# Patient Record
Sex: Male | Born: 1935 | Race: Black or African American | Hispanic: No | Marital: Married | Smoking: Former smoker
Health system: Southern US, Community
[De-identification: ages and names within clinical notes are randomized; demographics above are authoritative.]

## PROBLEM LIST (undated history)

## (undated) DIAGNOSIS — Z9289 Personal history of other medical treatment: Secondary | ICD-10-CM

## (undated) DIAGNOSIS — M159 Polyosteoarthritis, unspecified: Secondary | ICD-10-CM

## (undated) DIAGNOSIS — J449 Chronic obstructive pulmonary disease, unspecified: Secondary | ICD-10-CM

## (undated) DIAGNOSIS — K219 Gastro-esophageal reflux disease without esophagitis: Secondary | ICD-10-CM

## (undated) DIAGNOSIS — Z22322 Carrier or suspected carrier of Methicillin resistant Staphylococcus aureus: Secondary | ICD-10-CM

## (undated) DIAGNOSIS — N179 Acute kidney failure, unspecified: Secondary | ICD-10-CM

## (undated) DIAGNOSIS — I10 Essential (primary) hypertension: Secondary | ICD-10-CM

## (undated) DIAGNOSIS — N1330 Unspecified hydronephrosis: Secondary | ICD-10-CM

## (undated) HISTORY — PX: APPENDECTOMY: SHX54

## (undated) HISTORY — PX: HERNIA REPAIR: SHX51

## (undated) HISTORY — DX: Polyosteoarthritis, unspecified: M15.9

## (undated) HISTORY — DX: Gastro-esophageal reflux disease without esophagitis: K21.9

## (undated) HISTORY — DX: Unspecified hydronephrosis: N13.30

## (undated) HISTORY — DX: Chronic obstructive pulmonary disease, unspecified: J44.9

## (undated) HISTORY — DX: Acute kidney failure, unspecified: N17.9

## (undated) HISTORY — PX: TOTAL HIP ARTHROPLASTY: SHX124

## (undated) HISTORY — DX: Personal history of other medical treatment: Z92.89

## (undated) HISTORY — DX: Essential (primary) hypertension: I10

## (undated) HISTORY — DX: Carrier or suspected carrier of methicillin resistant Staphylococcus aureus: Z22.322

---

## 2010-08-15 ENCOUNTER — Ambulatory Visit (HOSPITAL_COMMUNITY): Payer: MEDICARE | Attending: Urology

## 2010-08-15 ENCOUNTER — Ambulatory Visit (HOSPITAL_BASED_OUTPATIENT_CLINIC_OR_DEPARTMENT_OTHER)
Admission: RE | Admit: 2010-08-15 | Discharge: 2010-08-15 | Disposition: A | Discharge status: Home / Self Care | Payer: MEDICARE | Source: Ambulatory Visit | Attending: Urology | Admitting: Urology

## 2010-08-15 ENCOUNTER — Ambulatory Visit (HOSPITAL_COMMUNITY): Payer: MEDICARE

## 2010-08-15 DIAGNOSIS — Z01818 Encounter for other preprocedural examination: Secondary | ICD-10-CM | POA: Insufficient documentation

## 2010-08-15 DIAGNOSIS — R0602 Shortness of breath: Secondary | ICD-10-CM | POA: Insufficient documentation

## 2010-08-15 DIAGNOSIS — Z01810 Encounter for preprocedural cardiovascular examination: Secondary | ICD-10-CM | POA: Insufficient documentation

## 2010-08-15 DIAGNOSIS — N35919 Unspecified urethral stricture, male, unspecified site: Secondary | ICD-10-CM | POA: Insufficient documentation

## 2010-08-15 DIAGNOSIS — N1330 Unspecified hydronephrosis: Secondary | ICD-10-CM | POA: Insufficient documentation

## 2010-08-15 DIAGNOSIS — Z01811 Encounter for preprocedural respiratory examination: Secondary | ICD-10-CM

## 2010-08-15 DIAGNOSIS — R079 Chest pain, unspecified: Secondary | ICD-10-CM | POA: Insufficient documentation

## 2010-08-17 NOTE — Op Note (Signed)
NAMEMARGO, Ray Howard                ACCOUNT NO.:  [id number]  MEDICAL RECORD NO.:  [id number]           PATIENT TYPE:  LOCATION:                                 FACILITY:  PHYSICIAN:  Lillette Boxer. Loghan Kurtzman, M.D.  DATE OF BIRTH:  DATE OF PROCEDURE: DATE OF DISCHARGE:                              OPERATIVE REPORT   PREOPERATIVE DIAGNOSIS:  Benign prostatic hypertrophy with obstruction.  POSTOPERATIVE DIAGNOSIS:  Urethral stricture, no evidence of benign prostatic hypertrophy.  PRINCIPAL PROCEDURE:  Cystoscopy, urethral dilation with balloon end [employer] sounds.  SURGEON:  Lillette Boxer. Kaiyan Luczak, MD  ANESTHESIA:  General with LMA.  COMPLICATIONS:  None.  SPECIMENS:  None.  BRIEF HISTORY:  This 77 year old male presents today for definitive management of bladder outlet obstruction.  He presented to me a few months ago with significant symptoms of bladder outlet obstruction and elevated postvoid residual urine volume.  Alpha blockers have not improved this.  He does have some mild hydronephrosis as well but has stable renal function.  This patient has significant symptomatology that has not responded to alpha-blocker management, he presents at this time for possible TURP. Risks and complications of the procedure have been discussed with the patient and his granddaughter.  They understand these and desire to proceed.  DESCRIPTION OF PROCEDURE:  The patient was identified in the holding area and received preoperative IV antibiotics.  He was taken to the operating room where general anesthetic was administered using the LMA. He was placed in the dorsolithotomy position.  We took great care to avoid significant traction on his right hip which has had a replacement. Following positioning, he was sterilely prepped and draped.  A time-out was then performed.  A 22-French panendoscope was advanced through his urethra.  There was a very dense stricture at his bulbous urethra.  This  was impassable with the scope.  I then placed a guidewire through the stricture, and fluoroscopically I could see it curled in the bladder, although the bladder looked like it had a huge postvoid residual.  I verified the position of the guidewire by passing an open-ended catheter over the guidewire and into the bladder and then withdrawing urine.  Once this was verified to be in the bladder, I replaced the guidewire, and then passed a 15-mm NephroMax balloon over top of the guidewire, through the stricture.  The stricture was then dilated to 16 atmospheres of pressure/15 mm.  Following this, I passed the scope through the urethra again.  The stricture was well dilated.  I then entered the prostatic fossa which showed evidence of a prior resection.  There was no obstruction.  The bladder was entered and inspected circumferentially. It was trabeculated.  There were no tumors or foreign bodies.  Ureteral orifices were normal.  At this point, I removed the scope and dilated the urethra up to 26-French using Le[employer] sounds.  I then placed an 23- [place] Foley catheter and hooked this to dependent drainage.  The patient will follow up in approximately 2 days in my office.  We will remove the catheter at that time.  He was discharged  on Cipro for 3 days.     Lillette Boxer. Zoeie Ritter, M.D.     SMD/MEDQ  D:  08/15/2010  T:  08/15/2010  Job:  [id number]  cc:   Bernerd Limbo, M.D. [id number]  Electronically Signed by Franchot Gallo M.D. on 08/17/2010 10:34:23 AM

## 2010-08-18 LAB — POCT HEMOGLOBIN-HEMACUE: Hemoglobin: 12.6 g/dL — ABNORMAL LOW (ref 13.0–17.0)

## 2018-03-07 ENCOUNTER — Encounter: Payer: Self-pay | Admitting: Internal Medicine

## 2018-03-07 ENCOUNTER — Non-Acute Institutional Stay (SKILLED_NURSING_FACILITY): Payer: Medicare HMO | Admitting: Internal Medicine

## 2018-03-07 DIAGNOSIS — R202 Paresthesia of skin: Secondary | ICD-10-CM | POA: Diagnosis not present

## 2018-03-07 DIAGNOSIS — E872 Acidosis, unspecified: Secondary | ICD-10-CM

## 2018-03-07 DIAGNOSIS — I10 Essential (primary) hypertension: Secondary | ICD-10-CM

## 2018-03-07 DIAGNOSIS — N185 Chronic kidney disease, stage 5: Secondary | ICD-10-CM

## 2018-03-07 DIAGNOSIS — N1330 Unspecified hydronephrosis: Secondary | ICD-10-CM

## 2018-03-07 DIAGNOSIS — K219 Gastro-esophageal reflux disease without esophagitis: Secondary | ICD-10-CM

## 2018-03-07 DIAGNOSIS — R4702 Dysphasia: Secondary | ICD-10-CM

## 2018-03-07 DIAGNOSIS — M5412 Radiculopathy, cervical region: Secondary | ICD-10-CM | POA: Diagnosis not present

## 2018-03-07 DIAGNOSIS — R29810 Facial weakness: Secondary | ICD-10-CM

## 2018-03-07 NOTE — Progress Notes (Signed)
:   Location:  [facility] Room Number: 408X Place of Service:  SNF (31)  Nakayla Rorabaugh D. Sheppard Coil, MD  Extended Emergency Contact Information Primary Emergency Contact: March,Courtney  [place] Phone: [phone] Relation: Granddaughter     Allergies: Patient has no known allergies.  Chief Complaint  Patient presents with  . New Admit To SNF    Admit to [facility]    HPI: Patient is 84 y.o. male stage V chronic kidney disease not on dialysis, hypertension, chronic dysphasia, who had undergone an elective left brachial fifth basilic fistula placement 2 days prior and was given a supraclavicular block.  After block patient had left facial droop left facial numbness, and inability to move his left arm.  Patient was admitted to the hospital from 9/30-10/2 where stroke was ruled out, and symptoms were felt to be from his left brachial block as a improved.  Patient underwent a modified barium swallow that demonstrated severe dysphasia with the recommendation that this person is not safe to consume any oral intake.  After discussion with patient he said he still enjoyed food and he was going to stay on modified diet and would consider a PEG tube only if he got in trouble.  Patient was felt to have generalized weakness and is admitted to SNF for OT/PT.  While at skilled nursing facility patient will be followed for hypertension treated with Norvasc, GERD treated with Prilosec and hypercalcemia  treated with calcitriol.  Past Medical History:  Diagnosis Date  . ARF (acute renal failure) ([place])   . Bilateral hydronephrosis   . Chronic obstructive pulmonary disease (COPD) ([place])   . Essential hypertension   . GERD (gastroesophageal reflux disease)   . MRSA (methicillin resistant staph aureus) culture positive   . Osteoarthritis of multiple joints   . Transfusion history    with hip surgery    Past Surgical History:  Procedure Laterality Date  .  APPENDECTOMY     1950's  . HERNIA REPAIR     Inguinal  . TOTAL HIP ARTHROPLASTY Right     Allergies as of 03/07/2018   No Known Allergies     Medication List        Accurate as of 03/07/18 11:36 AM. Always use your most recent med list.          acetaminophen 325 MG tablet Commonly known as:  TYLENOL Take 650 mg by mouth every 4 (four) hours as needed.   amLODipine 10 MG tablet Commonly known as:  NORVASC Take 10 mg by mouth daily.   calcitRIOL 0.5 MCG capsule Commonly known as:  ROCALTROL Take 0.5 mcg by mouth daily.   ipratropium-albuterol 0.5-2.5 (3) MG/3ML Soln Commonly known as:  DUONEB Take 3 mLs by nebulization.   omeprazole 20 MG capsule Commonly known as:  PRILOSEC Take 20 mg by mouth daily.   oxyCODONE 5 MG immediate release tablet Commonly known as:  Oxy IR/ROXICODONE Take 5 mg by mouth every 4 (four) hours as needed for severe pain.   sodium bicarbonate 650 MG tablet Take 650 mg by mouth 4 (four) times daily.       No orders of the defined types were placed in this encounter.    There is no immunization history on file for this patient.  Social History   Tobacco Use  . Smoking status: Former Smoker    Types: Cigarettes    Start date: 1949    Last attempt to  quit: 2004    Years since quitting: 15.7  . Smokeless tobacco: Never Used  Substance Use Topics  . Alcohol use: Not on file    Family history is   Family History  Problem Relation Age of Onset  . Heart attack Father   . Cancer Brother       Review of Systems  DATA OBTAINED: from patient, nurse GENERAL:  no fevers, fatigue, appetite changes SKIN: No itching, or rash EYES: No eye pain, redness, discharge EARS: No earache, tinnitus, change in hearing NOSE: No congestion, drainage or bleeding  MOUTH/THROAT: No mouth or tooth pain, No sore throat RESPIRATORY: No cough, wheezing, SOB CARDIAC: No chest pain, palpitations, lower extremity edema  GI: No abdominal pain, No  N/V/D or constipation, No heartburn or reflux  GU: No dysuria, frequency or urgency, or incontinence  MUSCULOSKELETAL: No unrelieved bone/joint pain NEUROLOGIC: No headache, dizziness or focal weakness PSYCHIATRIC: No c/o anxiety or sadness   Vitals:   03/07/18 1131  BP: 114/74  Pulse: 79  Resp: 18  Temp: 97.8 F (36.6 C)  SpO2: 97%    SpO2 Readings from Last 1 Encounters:  03/07/18 97%   Body mass index is 23.85 kg/m.     Physical Exam  GENERAL APPEARANCE: Alert, conversant,  No acute distress.  SKIN: No diaphoresis rash HEAD: Normocephalic, atraumatic  EYES: Conjunctiva/lids clear. Pupils round, reactive. EOMs intact.  EARS: External exam WNL, canals clear. Hearing grossly normal.  NOSE: No deformity or discharge.  MOUTH/THROAT: Lips w/o lesions  RESPIRATORY: Breathing is even, unlabored. Lung sounds are clear   CARDIOVASCULAR: Heart RRR no murmurs, rubs or gallops. No peripheral edema.   GASTROINTESTINAL: Abdomen is soft, non-tender, not distended w/ normal bowel sounds. GENITOURINARY: Bladder non tender, not distended  MUSCULOSKELETAL: No abnormal joints or musculature NEUROLOGIC:  Cranial nerves 2-12 grossly intact. Moves all extremities  PSYCHIATRIC: Mood and affect appropriate to situation, no behavioral issues  There are no active problems to display for this patient.     Labs reviewed: Basic Metabolic Panel: No results found for: NA, K, CL, CO2, GLUCOSE, BUN, CREATININE, CALCIUM, PROT, ALBUMIN, AST, ALT, ALKPHOS, BILITOT, GFRNONAA, GFRAA  No results for input(s): NA, K, CL, CO2, GLUCOSE, BUN, CREATININE, CALCIUM, MG, PHOS in the last 8760 hours. Liver Function Tests: No results for input(s): AST, ALT, ALKPHOS, BILITOT, PROT, ALBUMIN in the last 8760 hours. No results for input(s): LIPASE, AMYLASE in the last 8760 hours. No results for input(s): AMMONIA in the last 8760 hours. CBC: No results for input(s): WBC, NEUTROABS, HGB, HCT, MCV, PLT in the last  8760 hours. Lipid No results for input(s): CHOL, HDL, LDLCALC, TRIG in the last 8760 hours.  Cardiac Enzymes: No results for input(s): CKTOTAL, CKMB, CKMBINDEX, TROPONINI in the last 8760 hours. BNP: No results for input(s): BNP in the last 8760 hours. No results found for: MICROALBUR No results found for: HGBA1C No results found for: TSH No results found for: VITAMINB12 No results found for: FOLATE No results found for: IRON, TIBC, FERRITIN  Imaging and Procedures obtained prior to SNF admission: Dg Chest 2 View  Result Date: 08/15/2010 *RADIOLOGY REPORT* Clinical Data: Preop for prostate surgery.  Chest pain with shortness of breath. CHEST - 2 VIEW Comparison: None. Findings: Heart size normal.  Lungs mildly hyperaerated but clear. No congestive heart failure.  There are old bilateral rib fractures. IMPRESSION: No active disease.  The lungs are mildly hyperaerated. Original Report Authenticated By: Juanita Laster, M.D.  Not all labs, radiology exams or other studies done during hospitalization come through on my EPIC note; however they are reviewed by me.    Assessment and   Left facial weakness/dense anesthesia of left arm-status post brachial plexus block for brachial fistula formation 4 intermittent hemodialysis; negative MRI, improved neurological condition SNF -for OT/PT; continue ASA 81 mg daily  Chronic kidney disease stage IV-secondary to hypertension; received brachial fistula, followed by nephrology  Hypertension SNF -controlled; continue Norvasc 10 mg daily  Obstructive uropathy-previously catheter dependent now on intermittent catheterization SNF -continue in and out cath  Severe dysphasia- patient underwent modified barium swallow that demonstrated severe dysphasia with recommendation that patient is not able to consume any oral intake.  This was discussed with patient at length; patient said he enjoyed food and he will stay on modified diet and if he gets  into trouble and he will consider PEG tube SNF -will follow for signs or symptoms of aspiration pneumonia  GERD SNF -controlled; continue Prilosec 20 mg daily  Hypercalcemia SNF -continue calcitriol 0.5 mg p.o. Daily  Metabolic acidosis-secondary to chronic renal failure SNF -continue sodium bicarb 650 mg 2 p.o. 4 times daily   Time spent greater than 45 minutes;> 50% of time with patient was spent reviewing records, labs, tests and studies, counseling and developing plan of care  Webb Silversmith D. Sheppard Coil, MD

## 2018-03-11 LAB — BASIC METABOLIC PANEL
BUN: 61 — AB (ref 4–21)
CREATININE: 3.5 — AB (ref 0.6–1.3)
Glucose: 88
Potassium: 6.5 — AB (ref 3.4–5.3)
Sodium: 136 — AB (ref 137–147)

## 2018-03-11 LAB — CBC AND DIFFERENTIAL
HCT: 30 — AB (ref 41–53)
Hemoglobin: 9.6 — AB (ref 13.5–17.5)
Platelets: 200 (ref 150–399)
WBC: 5.1

## 2018-03-13 LAB — BASIC METABOLIC PANEL
BUN: 62 — AB (ref 4–21)
Creatinine: 3.6 — AB (ref 0.6–1.3)
GLUCOSE: 111
Potassium: 4.6 (ref 3.4–5.3)
SODIUM: 144 (ref 137–147)

## 2018-03-17 ENCOUNTER — Encounter: Payer: Self-pay | Admitting: Internal Medicine

## 2018-03-17 DIAGNOSIS — R202 Paresthesia of skin: Secondary | ICD-10-CM | POA: Insufficient documentation

## 2018-03-17 DIAGNOSIS — N1330 Unspecified hydronephrosis: Secondary | ICD-10-CM | POA: Insufficient documentation

## 2018-03-17 DIAGNOSIS — K219 Gastro-esophageal reflux disease without esophagitis: Secondary | ICD-10-CM | POA: Insufficient documentation

## 2018-03-17 DIAGNOSIS — R29810 Facial weakness: Secondary | ICD-10-CM | POA: Insufficient documentation

## 2018-03-17 DIAGNOSIS — N185 Chronic kidney disease, stage 5: Secondary | ICD-10-CM | POA: Insufficient documentation

## 2018-03-17 DIAGNOSIS — I10 Essential (primary) hypertension: Secondary | ICD-10-CM | POA: Insufficient documentation

## 2018-03-17 DIAGNOSIS — E872 Acidosis, unspecified: Secondary | ICD-10-CM | POA: Insufficient documentation

## 2018-03-17 DIAGNOSIS — R4702 Dysphasia: Secondary | ICD-10-CM | POA: Insufficient documentation

## 2018-03-17 DIAGNOSIS — M5412 Radiculopathy, cervical region: Secondary | ICD-10-CM | POA: Insufficient documentation

## 2018-03-21 LAB — BASIC METABOLIC PANEL
BUN: 60 — AB (ref 4–21)
Creatinine: 2.8 — AB (ref 0.6–1.3)
Glucose: 108
Potassium: 4.8 (ref 3.4–5.3)
SODIUM: 142 (ref 137–147)

## 2018-03-22 ENCOUNTER — Encounter: Payer: Self-pay | Admitting: Internal Medicine

## 2018-03-22 ENCOUNTER — Non-Acute Institutional Stay (SKILLED_NURSING_FACILITY): Payer: Medicare HMO | Admitting: Internal Medicine

## 2018-03-22 DIAGNOSIS — N185 Chronic kidney disease, stage 5: Secondary | ICD-10-CM

## 2018-03-22 DIAGNOSIS — R4702 Dysphasia: Secondary | ICD-10-CM

## 2018-03-22 DIAGNOSIS — I10 Essential (primary) hypertension: Secondary | ICD-10-CM

## 2018-03-22 DIAGNOSIS — R202 Paresthesia of skin: Secondary | ICD-10-CM | POA: Diagnosis not present

## 2018-03-22 DIAGNOSIS — M5412 Radiculopathy, cervical region: Secondary | ICD-10-CM

## 2018-03-22 DIAGNOSIS — K219 Gastro-esophageal reflux disease without esophagitis: Secondary | ICD-10-CM

## 2018-03-22 DIAGNOSIS — N1330 Unspecified hydronephrosis: Secondary | ICD-10-CM

## 2018-03-22 DIAGNOSIS — R29810 Facial weakness: Secondary | ICD-10-CM | POA: Diagnosis not present

## 2018-03-22 DIAGNOSIS — E872 Acidosis, unspecified: Secondary | ICD-10-CM

## 2018-03-22 NOTE — Progress Notes (Addendum)
Location:  [facility] Room Number: 161W Place of Service:  SNF (31)  Noah Delaine. Sheppard Coil, MD  Patient Care Team: Hennie Duos, MD as PCP - General (Internal Medicine)  Extended Emergency Contact Information Primary Emergency Contact: Harvey, [place] Phone: [phone] Relation: Daughter Secondary Emergency Contact: Charyl Dancer [place] Mobile Phone: [phone] Relation: Daughter  No Known Allergies  Chief Complaint  Patient presents with  . Discharge Note    Discharge from [facility]    HPI:  84 y.o. male with stage V chronic kidney disease not on dialysis, hypertension, chronic dysphasia, who had undergone an elective left brachial  basilic fistula placement 2 days prior and was given a supra clavicular block.  After the block patient had a left facial droop, left facial numbness and inability to move his left arm.  Patient was admitted to [facility] from 9/30-10/2 where stroke was ruled out and symptoms were felt to be from his left brachial block as there has been improvement.  Patient underwent a modified barium swallow that demonstrated severe dysphasia with the recommendation that this person was not safe to consume any oral intake.  After discussion with patient he said he enjoyed food he was going to stay on modified diet and will consider to PEG tube only if he got in trouble.  Patient was told to have generalized weakness and was admitted to skilled nursing facility for OT/PT.  Patient is now ready to be discharged to home.    Past Medical History:  Diagnosis Date  . ARF (acute renal failure) ([place])   . Bilateral hydronephrosis   . Chronic obstructive pulmonary disease (COPD) ([place])   . Essential hypertension   . GERD (gastroesophageal reflux disease)   . MRSA (methicillin resistant staph aureus) culture positive   . Osteoarthritis of multiple joints   . Transfusion history    with hip  surgery    Past Surgical History:  Procedure Laterality Date  . APPENDECTOMY     1950's  . HERNIA REPAIR     Inguinal  . TOTAL HIP ARTHROPLASTY Right      reports that he quit smoking about 15 years ago. His smoking use included cigarettes. He started smoking about 70 years ago. He has never used smokeless tobacco. His alcohol and drug histories are not on file. Social History   Socioeconomic History  . Marital status: Married    Spouse name: Not on file  . Number of children: Not on file  . Years of education: Not on file  . Highest education level: Not on file  Occupational History  . Not on file  Social Needs  . Financial resource strain: Not on file  . Food insecurity:    Worry: Not on file    Inability: Not on file  . Transportation needs:    Medical: Not on file    Non-medical: Not on file  Tobacco Use  . Smoking status: Former Smoker    Types: Cigarettes    Start date: 1949    Last attempt to quit: 2004    Years since quitting: 15.8  . Smokeless tobacco: Never Used  Substance and Sexual Activity  . Alcohol use: Not on file  . Drug use: Not on file  . Sexual activity: Not on file  Lifestyle  . Physical activity:    Days per week: Not on file    Minutes per session: Not on file  .  Stress: Not on file  Relationships  . Social connections:    Talks on phone: Not on file    Gets together: Not on file    Attends religious service: Not on file    Active member of club or organization: Not on file    Attends meetings of clubs or organizations: Not on file    Relationship status: Not on file  . Intimate partner violence:    Fear of current or ex partner: Not on file    Emotionally abused: Not on file    Physically abused: Not on file    Forced sexual activity: Not on file  Other Topics Concern  . Not on file  Social History Narrative  . Not on file    Pertinent  Health Maintenance Due  Topic Date Due  . PNA vac Low Risk Adult (1 of 2 - PCV13)  04/20/1995  . INFLUENZA VACCINE  01/03/2018    Medications: Allergies as of 03/22/2018   No Known Allergies     Medication List        Accurate as of 03/22/18 11:59 PM. Always use your most recent med list.          acetaminophen 325 MG tablet Commonly known as:  TYLENOL Take 650 mg by mouth every 4 (four) hours as needed.   amLODipine 10 MG tablet Commonly known as:  NORVASC Take 10 mg by mouth daily.   calcitRIOL 0.5 MCG capsule Commonly known as:  ROCALTROL Take 0.5 mcg by mouth daily.   ipratropium-albuterol 0.5-2.5 (3) MG/3ML Soln Commonly known as:  DUONEB Take 3 mLs by nebulization.   omeprazole 20 MG capsule Commonly known as:  PRILOSEC Take 20 mg by mouth daily.   sodium bicarbonate 650 MG tablet Take 650 mg by mouth 4 (four) times daily.        Vitals:   03/22/18 1125  BP: 114/60  Pulse: 90  Resp: 17  Temp: 98 F (36.7 C)  Weight: 171 lb (77.6 kg)  Height: 5\' 11"  (1.803 m)   Body mass index is 23.85 kg/m.  Physical Exam  GENERAL APPEARANCE: Alert, conversant. No acute distress.  HEENT: Unremarkable. RESPIRATORY: Breathing is even, unlabored. Lung sounds are clear   CARDIOVASCULAR: Heart RRR no murmurs, rubs or gallops. No peripheral edema.  GASTROINTESTINAL: Abdomen is soft, non-tender, not distended w/ normal bowel sounds.  NEUROLOGIC: Cranial nerves 2-12 grossly intact. Moves all extremities   Labs reviewed: Basic Metabolic Panel: Recent Labs    03/11/18 03/13/18 03/21/18  NA 136* 144 142  K 6.5* 4.6 4.8  BUN 61* 62* 60*  CREATININE 3.5* 3.6* 2.8*   No results found for: [facility] Liver Function Tests: No results for input(s): AST, ALT, ALKPHOS, BILITOT, PROT, ALBUMIN in the last 8760 hours. No results for input(s): LIPASE, AMYLASE in the last 8760 hours. No results for input(s): AMMONIA in the last 8760 hours. CBC: Recent Labs    03/11/18  WBC 5.1  HGB 9.6*  HCT 30*  PLT 200   Lipid No results for input(s): CHOL,  HDL, LDLCALC, TRIG in the last 8760 hours. Cardiac Enzymes: No results for input(s): CKTOTAL, CKMB, CKMBINDEX, TROPONINI in the last 8760 hours. BNP: No results for input(s): BNP in the last 8760 hours. CBG: No results for input(s): GLUCAP in the last 8760 hours.  Procedures and Imaging Studies During Stay: No results found.  Assessment/Plan:   Brachial plexus neuralgia  Paresthesia of left arm  Facial muscle weakness  Dysphasia  Bilateral hydronephrosis  Essential hypertension  Chronic kidney disease, stage V (HCC)  Hypercalcemia  Metabolic acidosis  Gastroesophageal reflux disease without esophagitis   Patient is being discharged with the following home health services: OT/PT/ST/Nursing  Patient is being discharged with the following durable medical equipment: standard WC  Patient has been advised to f/u with their PCP in 1-2 weeks to bring them up to date on their rehab stay.  Social services at facility was responsible for arranging this appointment.  Pt was provided with a 30 day supply of prescriptions for medications and refills must be obtained from their PCP.  For controlled substances, a more limited supply may be provided adequate until PCP appointment only.  Medications have been reconciled  Time spent greater than 30 minutes;> 50% of time with patient was spent reviewing records, labs, tests and studies, counseling and developing plan of care  Noah Delaine. Sheppard Coil, MD

## 2018-03-23 ENCOUNTER — Encounter: Payer: Self-pay | Admitting: Internal Medicine

## 2018-03-25 DIAGNOSIS — R29810 Facial weakness: Secondary | ICD-10-CM

## 2018-03-25 DIAGNOSIS — N185 Chronic kidney disease, stage 5: Secondary | ICD-10-CM

## 2018-03-25 DIAGNOSIS — M5412 Radiculopathy, cervical region: Secondary | ICD-10-CM

## 2018-03-25 DIAGNOSIS — K219 Gastro-esophageal reflux disease without esophagitis: Secondary | ICD-10-CM

## 2018-03-25 DIAGNOSIS — R4702 Dysphasia: Secondary | ICD-10-CM

## 2018-03-25 DIAGNOSIS — E872 Acidosis, unspecified: Secondary | ICD-10-CM

## 2018-03-25 DIAGNOSIS — I10 Essential (primary) hypertension: Secondary | ICD-10-CM

## 2018-03-25 DIAGNOSIS — N1330 Unspecified hydronephrosis: Secondary | ICD-10-CM

## 2018-03-25 DIAGNOSIS — R202 Paresthesia of skin: Secondary | ICD-10-CM

## 2018-03-25 LAB — CBC AND DIFFERENTIAL
HEMATOCRIT: 26 — AB (ref 41–53)
Hemoglobin: 8.4 — AB (ref 13.5–17.5)
PLATELETS: 280 (ref 150–399)
WBC: 6.1

## 2018-03-26 NOTE — Progress Notes (Signed)
Location:      Place of Service:     PCP: Hennie Duos, MD Patient Care Team: Hennie Duos, MD as PCP - General (Internal Medicine)  Extended Emergency Contact Information Primary Emergency Contact: Fishel, [place] Mobile Phone: [phone] Relation: Daughter Secondary Emergency Contact: Charyl Dancer [place] Mobile Phone: [phone] Relation: Daughter  No Known Allergies  No chief complaint on file.   HPI:  84 y.o. male      Past Medical History:  Diagnosis Date  . ARF (acute renal failure) ([place])   . Bilateral hydronephrosis   . Chronic obstructive pulmonary disease (COPD) ([place])   . Essential hypertension   . GERD (gastroesophageal reflux disease)   . MRSA (methicillin resistant staph aureus) culture positive   . Osteoarthritis of multiple joints   . Transfusion history    with hip surgery    Past Surgical History:  Procedure Laterality Date  . APPENDECTOMY     1950's  . HERNIA REPAIR     Inguinal  . TOTAL HIP ARTHROPLASTY Right      reports that he quit smoking about 15 years ago. His smoking use included cigarettes. He started smoking about 70 years ago. He has never used smokeless tobacco. His alcohol and drug histories are not on file. Social History   Socioeconomic History  . Marital status: Married    Spouse name: Not on file  . Number of children: Not on file  . Years of education: Not on file  . Highest education level: Not on file  Occupational History  . Not on file  Social Needs  . Financial resource strain: Not on file  . Food insecurity:    Worry: Not on file    Inability: Not on file  . Transportation needs:    Medical: Not on file    Non-medical: Not on file  Tobacco Use  . Smoking status: Former Smoker    Types: Cigarettes    Start date: 1949    Last attempt to quit: 2004    Years since quitting: 15.8  . Smokeless tobacco: Never Used  Substance and Sexual Activity  . Alcohol use: Not on  file  . Drug use: Not on file  . Sexual activity: Not on file  Lifestyle  . Physical activity:    Days per week: Not on file    Minutes per session: Not on file  . Stress: Not on file  Relationships  . Social connections:    Talks on phone: Not on file    Gets together: Not on file    Attends religious service: Not on file    Active member of club or organization: Not on file    Attends meetings of clubs or organizations: Not on file    Relationship status: Not on file  . Intimate partner violence:    Fear of current or ex partner: Not on file    Emotionally abused: Not on file    Physically abused: Not on file    Forced sexual activity: Not on file  Other Topics Concern  . Not on file  Social History Narrative  . Not on file    Pertinent  Health Maintenance Due  Topic Date Due  . PNA vac Low Risk Adult (1 of 2 - PCV13) 04/20/1995  . INFLUENZA VACCINE  01/03/2018    Medications: Allergies as of 03/26/2018   No Known Allergies     Medication List  Accurate as of 03/26/18 10:22 AM. Always use your most recent med list.          acetaminophen 325 MG tablet Commonly known as:  TYLENOL Take 650 mg by mouth every 4 (four) hours as needed.   amLODipine 10 MG tablet Commonly known as:  NORVASC Take 10 mg by mouth daily.   calcitRIOL 0.5 MCG capsule Commonly known as:  ROCALTROL Take 0.5 mcg by mouth daily.   ipratropium-albuterol 0.5-2.5 (3) MG/3ML Soln Commonly known as:  DUONEB Take 3 mLs by nebulization.   omeprazole 20 MG capsule Commonly known as:  PRILOSEC Take 20 mg by mouth daily.   sodium bicarbonate 650 MG tablet Take 650 mg by mouth 4 (four) times daily.        There were no vitals filed for this visit. There is no height or weight on file to calculate BMI.  Physical Exam  GENERAL APPEARANCE: Alert, conversant. No acute distress.  HEENT: Unremarkable. RESPIRATORY: Breathing is even, unlabored. Lung sounds are clear     CARDIOVASCULAR: Heart RRR no murmurs, rubs or gallops. No peripheral edema.  GASTROINTESTINAL: Abdomen is soft, non-tender, not distended w/ normal bowel sounds.  NEUROLOGIC: Cranial nerves 2-12 grossly intact. Moves all extremities   Labs reviewed: Basic Metabolic Panel: Recent Labs    03/11/18 03/13/18 03/21/18  NA 136* 144 142  K 6.5* 4.6 4.8  BUN 61* 62* 60*  CREATININE 3.5* 3.6* 2.8*   No results found for: [facility] Liver Function Tests: No results for input(s): AST, ALT, ALKPHOS, BILITOT, PROT, ALBUMIN in the last 8760 hours. No results for input(s): LIPASE, AMYLASE in the last 8760 hours. No results for input(s): AMMONIA in the last 8760 hours. CBC: Recent Labs    03/11/18 03/25/18  WBC 5.1 6.1  HGB 9.6* 8.4*  HCT 30* 26*  PLT 200 280   Lipid No results for input(s): CHOL, HDL, LDLCALC, TRIG in the last 8760 hours. Cardiac Enzymes: No results for input(s): CKTOTAL, CKMB, CKMBINDEX, TROPONINI in the last 8760 hours. BNP: No results for input(s): BNP in the last 8760 hours. CBG: No results for input(s): GLUCAP in the last 8760 hours.  Procedures and Imaging Studies During Stay: No results found.  Assessment/Plan:   No diagnosis found.   Patient is being discharged with the following home health services:    Patient is being discharged with the following durable medical equipment:    Patient has been advised to f/u with their PCP in 1-2 weeks to bring them up to date on their rehab stay.  Social services at facility was responsible for arranging this appointment.  Pt was provided with a 30 day supply of prescriptions for medications and refills must be obtained from their PCP.  For controlled substances, a more limited supply may be provided adequate until PCP appointment only.  Future labs/tests needed:   Inocencio Homes, MD

## 2018-03-26 NOTE — Progress Notes (Signed)
Location:      Place of Service:     PCP: Hennie Duos, MD Patient Care Team: Hennie Duos, MD as PCP - General (Internal Medicine)  Extended Emergency Contact Information Primary Emergency Contact: Brody, [place] Mobile Phone: [phone] Relation: Daughter Secondary Emergency Contact: Charyl Dancer [place] Mobile Phone: [phone] Relation: Daughter  No Known Allergies  No chief complaint on file.   HPI:  84 y.o. male      Past Medical History:  Diagnosis Date  . ARF (acute renal failure) ([place])   . Bilateral hydronephrosis   . Chronic obstructive pulmonary disease (COPD) ([place])   . Essential hypertension   . GERD (gastroesophageal reflux disease)   . MRSA (methicillin resistant staph aureus) culture positive   . Osteoarthritis of multiple joints   . Transfusion history    with hip surgery    Past Surgical History:  Procedure Laterality Date  . APPENDECTOMY     1950's  . HERNIA REPAIR     Inguinal  . TOTAL HIP ARTHROPLASTY Right      reports that he quit smoking about 15 years ago. His smoking use included cigarettes. He started smoking about 70 years ago. He has never used smokeless tobacco. His alcohol and drug histories are not on file. Social History   Socioeconomic History  . Marital status: Married    Spouse name: Not on file  . Number of children: Not on file  . Years of education: Not on file  . Highest education level: Not on file  Occupational History  . Not on file  Social Needs  . Financial resource strain: Not on file  . Food insecurity:    Worry: Not on file    Inability: Not on file  . Transportation needs:    Medical: Not on file    Non-medical: Not on file  Tobacco Use  . Smoking status: Former Smoker    Types: Cigarettes    Start date: 1949    Last attempt to quit: 2004    Years since quitting: 15.8  . Smokeless tobacco: Never Used  Substance and Sexual Activity  . Alcohol use: Not on  file  . Drug use: Not on file  . Sexual activity: Not on file  Lifestyle  . Physical activity:    Days per week: Not on file    Minutes per session: Not on file  . Stress: Not on file  Relationships  . Social connections:    Talks on phone: Not on file    Gets together: Not on file    Attends religious service: Not on file    Active member of club or organization: Not on file    Attends meetings of clubs or organizations: Not on file    Relationship status: Not on file  . Intimate partner violence:    Fear of current or ex partner: Not on file    Emotionally abused: Not on file    Physically abused: Not on file    Forced sexual activity: Not on file  Other Topics Concern  . Not on file  Social History Narrative  . Not on file    Pertinent  Health Maintenance Due  Topic Date Due  . PNA vac Low Risk Adult (1 of 2 - PCV13) 04/20/1995  . INFLUENZA VACCINE  01/03/2018    Medications: Allergies as of 03/25/2018   No Known Allergies     Medication List  Accurate as of 03/25/18 11:59 PM. Always use your most recent med list.          acetaminophen 325 MG tablet Commonly known as:  TYLENOL Take 650 mg by mouth every 4 (four) hours as needed.   amLODipine 10 MG tablet Commonly known as:  NORVASC Take 10 mg by mouth daily.   calcitRIOL 0.5 MCG capsule Commonly known as:  ROCALTROL Take 0.5 mcg by mouth daily.   ipratropium-albuterol 0.5-2.5 (3) MG/3ML Soln Commonly known as:  DUONEB Take 3 mLs by nebulization.   omeprazole 20 MG capsule Commonly known as:  PRILOSEC Take 20 mg by mouth daily.   sodium bicarbonate 650 MG tablet Take 650 mg by mouth 4 (four) times daily.        There were no vitals filed for this visit. There is no height or weight on file to calculate BMI.  Physical Exam  GENERAL APPEARANCE: Alert, conversant. No acute distress.  HEENT: Unremarkable. RESPIRATORY: Breathing is even, unlabored. Lung sounds are clear     CARDIOVASCULAR: Heart RRR no murmurs, rubs or gallops. No peripheral edema.  GASTROINTESTINAL: Abdomen is soft, non-tender, not distended w/ normal bowel sounds.  NEUROLOGIC: Cranial nerves 2-12 grossly intact. Moves all extremities   Labs reviewed: Basic Metabolic Panel: Recent Labs    03/11/18 03/13/18 03/21/18  NA 136* 144 142  K 6.5* 4.6 4.8  BUN 61* 62* 60*  CREATININE 3.5* 3.6* 2.8*   No results found for: [facility] Liver Function Tests: No results for input(s): AST, ALT, ALKPHOS, BILITOT, PROT, ALBUMIN in the last 8760 hours. No results for input(s): LIPASE, AMYLASE in the last 8760 hours. No results for input(s): AMMONIA in the last 8760 hours. CBC: Recent Labs    03/11/18 03/25/18  WBC 5.1 6.1  HGB 9.6* 8.4*  HCT 30* 26*  PLT 200 280   Lipid No results for input(s): CHOL, HDL, LDLCALC, TRIG in the last 8760 hours. Cardiac Enzymes: No results for input(s): CKTOTAL, CKMB, CKMBINDEX, TROPONINI in the last 8760 hours. BNP: No results for input(s): BNP in the last 8760 hours. CBG: No results for input(s): GLUCAP in the last 8760 hours.  Procedures and Imaging Studies During Stay: No results found.  Assessment/Plan:   Paresthesia of left arm  Hypercalcemia  Facial muscle weakness  Brachial plexus neuralgia  Chronic kidney disease, stage V (HCC)  Bilateral hydronephrosis  Metabolic acidosis  Dysphasia   Patient is being discharged with the following home health services: OT/PT/ST/Nursing   Patient is being discharged with the following durable medical equipment:  Standard WC  Patient has been advised to f/u with their PCP in 1-2 weeks to bring them up to date on their rehab stay.  Social services at facility was responsible for arranging this appointment.  Pt was provided with a 30 day supply of prescriptions for medications and refills must be obtained from their PCP.  For controlled substances, a more limited supply may be provided adequate until  PCP appointment only  Medication have been reconciled   Time spent > 30 min;> 50% of time with patient was spent reviewing records, labs, tests and studies, counseling and developing plan of care  Inocencio Homes, MD

## 2018-03-31 DIAGNOSIS — I120 Hypertensive chronic kidney disease with stage 5 chronic kidney disease or end stage renal disease: Secondary | ICD-10-CM

## 2018-03-31 DIAGNOSIS — D631 Anemia in chronic kidney disease: Secondary | ICD-10-CM

## 2018-03-31 DIAGNOSIS — N185 Chronic kidney disease, stage 5: Secondary | ICD-10-CM

## 2018-03-31 DIAGNOSIS — G540 Brachial plexus disorders: Secondary | ICD-10-CM

## 2018-03-31 DIAGNOSIS — G902 Horner's syndrome: Secondary | ICD-10-CM

## 2018-04-04 ENCOUNTER — Non-Acute Institutional Stay (SKILLED_NURSING_FACILITY): Payer: Medicare HMO | Admitting: Internal Medicine

## 2018-04-04 ENCOUNTER — Encounter: Payer: Self-pay | Admitting: Internal Medicine

## 2018-04-04 DIAGNOSIS — I10 Essential (primary) hypertension: Secondary | ICD-10-CM

## 2018-04-04 DIAGNOSIS — E875 Hyperkalemia: Secondary | ICD-10-CM

## 2018-04-04 DIAGNOSIS — N185 Chronic kidney disease, stage 5: Secondary | ICD-10-CM | POA: Diagnosis not present

## 2018-04-04 DIAGNOSIS — K219 Gastro-esophageal reflux disease without esophagitis: Secondary | ICD-10-CM

## 2018-04-04 DIAGNOSIS — J449 Chronic obstructive pulmonary disease, unspecified: Secondary | ICD-10-CM

## 2018-04-04 NOTE — Progress Notes (Signed)
:    Location:  [place] Room Number: 111P Place of Service:  SNF (31)  Kajuana Shareef D. Sheppard Coil, MD  Patient Care Team: Hennie Duos, MD as PCP - General (Internal Medicine)  Extended Emergency Contact Information Primary Emergency Contact: [place], [place] Mobile Phone: [phone] Relation: Daughter Secondary Emergency Contact: Charyl Dancer [place] Mobile Phone: [phone] Relation: Daughter     Allergies: Patient has no known allergies.  Chief Complaint  Patient presents with  . Readmit To SNF    Admit to [facility]    HPI: Patient is 84 y.o. male with chronic kidney disease stage V not on dialysis, anemia, BPH, COPD, GERD, who was admitted to [facility] from 10/20 8-30 for a planned Soo peripheralization of the left radial basilic fistula.  There were no complications from the surgery.  Patient had a good thrill after the procedure.  Hospitalization was comp gated by hyperkalemia with no changes in EKG noted.  Patient was given 1 dose of Kayexalate and Lasix for potassium of 6.  After the intervention the potassium decreased to five-point 4 in the morning of discharge the potassium was 4.4 patient's creatinine on admission was 3.39 and on discharge was 2.92.  Patient is admitted back to skilled nursing facility for continued OT/PT while at skilled nursing facility patient will be followed for hypertension treated with Norvasc, GERD treated with COPD treated with as needed Duoneb.  Past Medical History:  Diagnosis Date  . ARF (acute renal failure) ([place])   . Bilateral hydronephrosis   . Chronic obstructive pulmonary disease (COPD) ([place])   . Essential hypertension   . GERD (gastroesophageal reflux disease)   . MRSA (methicillin resistant staph aureus) culture positive   . Osteoarthritis of multiple joints   . Transfusion history    with hip surgery    Past Surgical History:  Procedure Laterality Date    . APPENDECTOMY     1950's  . HERNIA REPAIR     Inguinal  . TOTAL HIP ARTHROPLASTY Right     Allergies as of 04/04/2018   No Known Allergies     Medication List        Accurate as of 04/04/18  2:54 PM. Always use your most recent med list.          acetaminophen 325 MG tablet Commonly known as:  TYLENOL Take 650 mg by mouth every 4 (four) hours as needed.   amLODipine 10 MG tablet Commonly known as:  NORVASC Take 10 mg by mouth daily.   calcitRIOL 0.5 MCG capsule Commonly known as:  ROCALTROL Take 0.5 mcg by mouth daily.   ipratropium-albuterol 0.5-2.5 (3) MG/3ML Soln Commonly known as:  DUONEB Take 3 mLs by nebulization.   omeprazole 20 MG capsule Commonly known as:  PRILOSEC Take 20 mg by mouth daily.   sodium bicarbonate 650 MG tablet Take 650 mg by mouth 4 (four) times daily.   traMADol 50 MG tablet Commonly known as:  ULTRAM Take by mouth every 6 (six) hours as needed.       No orders of the defined types were placed in this encounter.    There is no immunization history on file for this patient.  Social History   Tobacco Use  . Smoking status: Former Smoker    Types: Cigarettes    Start date: 1949    Last attempt to quit: 2004    Years since quitting: 15.8  .  Smokeless tobacco: Never Used  Substance Use Topics  . Alcohol use: Not on file    Family history is   Family History  Problem Relation Age of Onset  . Heart attack Father   . Cancer Brother       Review of Systems  DATA OBTAINED: from patient, nurse GENERAL:  no fevers, fatigue, appetite changes SKIN: No itching, or rash EYES: No eye pain, redness, discharge EARS: No earache, tinnitus, change in hearing NOSE: No congestion, drainage or bleeding  MOUTH/THROAT: No mouth or tooth pain, No sore throat RESPIRATORY: No cough, wheezing, SOB CARDIAC: No chest pain, palpitations, lower extremity edema  GI: No abdominal pain, No N/V/D or constipation, No heartburn or reflux   GU: No dysuria, frequency or urgency, or incontinence  MUSCULOSKELETAL: No unrelieved bone/joint pain NEUROLOGIC: No headache, dizziness or focal weakness PSYCHIATRIC: No c/o anxiety or sadness   Vitals:   04/04/18 1451  BP: 111/68  Pulse: 94  Resp: 18  Temp: (!) 97.4 F (36.3 C)    SpO2 Readings from Last 1 Encounters:  03/07/18 97%   Body mass index is 22.18 kg/m.     Physical Exam  GENERAL APPEARANCE: Alert, conversant,  No acute distress.  SKIN: No diaphoresis rash HEAD: Normocephalic, atraumatic  EYES: Conjunctiva/lids clear. Pupils round, reactive. EOMs intact.  EARS: External exam WNL, canals clear. Hearing grossly normal.  NOSE: No deformity or discharge.  MOUTH/THROAT: Lips w/o lesions  RESPIRATORY: Breathing is even, unlabored. Lung sounds are clear   CARDIOVASCULAR: Heart RRR no murmurs, rubs or gallops. No peripheral edema.  Left arm with minimal thrill on dressing in place GASTROINTESTINAL: Abdomen is soft, non-tender, not distended w/ normal bowel sounds. GENITOURINARY: Bladder non tender, not distended  MUSCULOSKELETAL: No abnormal joints or musculature NEUROLOGIC:  Cranial nerves 2-12 grossly intact. Moves all extremities  PSYCHIATRIC: Mood and affect appropriate to situation, no behavioral issues  Patient Active Problem List   Diagnosis Date Noted  . Facial muscle weakness 03/17/2018  . Paresthesia of left arm 03/17/2018  . Brachial plexus neuralgia 03/17/2018  . Chronic kidney disease, stage V ([place]) 03/17/2018  . Essential hypertension 03/17/2018  . Bilateral hydronephrosis 03/17/2018  . Dysphasia 03/17/2018  . GERD (gastroesophageal reflux disease) 03/17/2018  . Hypercalcemia 03/17/2018  . Metabolic acidosis 24/23/5361      Labs reviewed: Basic Metabolic Panel:    Component Value Date/Time   NA 142 03/21/2018   K 4.8 03/21/2018   BUN 60 (A) 03/21/2018   CREATININE 2.8 (A) 03/21/2018    Recent Labs    03/11/18 03/13/18 03/21/18   NA 136* 144 142  K 6.5* 4.6 4.8  BUN 61* 62* 60*  CREATININE 3.5* 3.6* 2.8*   Liver Function Tests: No results for input(s): AST, ALT, ALKPHOS, BILITOT, PROT, ALBUMIN in the last 8760 hours. No results for input(s): LIPASE, AMYLASE in the last 8760 hours. No results for input(s): AMMONIA in the last 8760 hours. CBC: Recent Labs    03/11/18 03/25/18  WBC 5.1 6.1  HGB 9.6* 8.4*  HCT 30* 26*  PLT 200 280   Lipid No results for input(s): CHOL, HDL, LDLCALC, TRIG in the last 8760 hours.  Cardiac Enzymes: No results for input(s): CKTOTAL, CKMB, CKMBINDEX, TROPONINI in the last 8760 hours. BNP: No results for input(s): BNP in the last 8760 hours. No results found for: MICROALBUR No results found for: HGBA1C No results found for: TSH No results found for: VITAMINB12 No results found for: FOLATE No results found  for: IRON, TIBC, FERRITIN  Imaging and Procedures obtained prior to SNF admission: Dg Chest 2 View  Result Date: 08/15/2010 *RADIOLOGY REPORT* Clinical Data: Preop for prostate surgery.  Chest pain with shortness of breath. CHEST - 2 VIEW Comparison: None. Findings: Heart size normal.  Lungs mildly hyperaerated but clear. No congestive heart failure.  There are old bilateral rib fractures. IMPRESSION: No active disease.  The lungs are mildly hyperaerated. Original Report Authenticated By: Juanita Laster, M.D.    Not all labs, radiology exams or other studies done during hospitalization come through on my EPIC note; however they are reviewed by me.    Assessment and Plan  Chronic kidney disease stage V, not on dialysis/status post AV fistula surgery- no reported complications SNF- admitted back to skilled nursing facility for OT/PT; wound care; continue calcitriol 0.5 mg daily, sodium bicarb 650 mg 2 p.o. 3 times daily  Hyperkalemia-resolved with Kayexalate and Lasix SNF- follow-up BMP  Hypertension SNF- controlled; continue Norvasc 10 mg daily  GERD SNF-  controlled on Prilosec 20 mg daily; continue current medication  COPD SNF- DuoNeb as needed to continue   Time spent greater than 35 minutes;> 50% of time with patient was spent reviewing records, labs, tests and studies, counseling and developing plan of care  Webb Silversmith D. Sheppard Coil, MD

## 2018-04-07 ENCOUNTER — Encounter: Payer: Self-pay | Admitting: Internal Medicine

## 2018-04-07 DIAGNOSIS — J449 Chronic obstructive pulmonary disease, unspecified: Secondary | ICD-10-CM | POA: Insufficient documentation

## 2018-04-07 DIAGNOSIS — E875 Hyperkalemia: Secondary | ICD-10-CM | POA: Insufficient documentation

## 2018-04-08 LAB — CBC AND DIFFERENTIAL
HEMATOCRIT: 26 — AB (ref 41–53)
Hemoglobin: 8.7 — AB (ref 13.5–17.5)
PLATELETS: 207 (ref 150–399)
WBC: 4.1

## 2018-04-08 LAB — BASIC METABOLIC PANEL
BUN: 49 — AB (ref 4–21)
CREATININE: 2.8 — AB (ref 0.6–1.3)
GLUCOSE: 136
Potassium: 4.4 (ref 3.4–5.3)
Sodium: 142 (ref 137–147)

## 2018-04-09 LAB — CBC AND DIFFERENTIAL
HCT: 26 — AB (ref 41–53)
Hemoglobin: 8.5 — AB (ref 13.5–17.5)
Platelets: 204 (ref 150–399)
WBC: 4.1

## 2018-04-13 LAB — CBC AND DIFFERENTIAL
HEMATOCRIT: 32 — AB (ref 41–53)
Hemoglobin: 10.1 — AB (ref 13.5–17.5)
Platelets: 265 (ref 150–399)
WBC: 5.5

## 2018-04-13 LAB — BASIC METABOLIC PANEL
BUN: 79 — AB (ref 4–21)
CREATININE: 3.2 — AB (ref 0.6–1.3)
GLUCOSE: 132
Potassium: 5 (ref 3.4–5.3)
Sodium: 137 (ref 137–147)

## 2018-04-19 ENCOUNTER — Non-Acute Institutional Stay (SKILLED_NURSING_FACILITY): Payer: Medicare HMO | Admitting: Internal Medicine

## 2018-04-19 DIAGNOSIS — E875 Hyperkalemia: Secondary | ICD-10-CM | POA: Diagnosis not present

## 2018-04-19 DIAGNOSIS — I10 Essential (primary) hypertension: Secondary | ICD-10-CM | POA: Diagnosis not present

## 2018-04-19 DIAGNOSIS — N185 Chronic kidney disease, stage 5: Secondary | ICD-10-CM

## 2018-04-19 DIAGNOSIS — R4702 Dysphasia: Secondary | ICD-10-CM | POA: Diagnosis not present

## 2018-04-19 DIAGNOSIS — J449 Chronic obstructive pulmonary disease, unspecified: Secondary | ICD-10-CM

## 2018-04-20 ENCOUNTER — Other Ambulatory Visit: Payer: Self-pay | Admitting: Internal Medicine

## 2018-04-20 ENCOUNTER — Encounter: Payer: Self-pay | Admitting: Internal Medicine

## 2018-04-20 DIAGNOSIS — I10 Essential (primary) hypertension: Secondary | ICD-10-CM

## 2018-04-20 DIAGNOSIS — J449 Chronic obstructive pulmonary disease, unspecified: Secondary | ICD-10-CM

## 2018-04-20 MED ORDER — AMLODIPINE BESYLATE 10 MG PO TABS: 10.0000 mg | ORAL_TABLET | Freq: Every day | ORAL | 0 refills | Status: DC

## 2018-04-20 MED ORDER — IPRATROPIUM-ALBUTEROL 0.5-2.5 (3) MG/3ML IN SOLN: 3.0000 mL | Freq: Four times a day (QID) | RESPIRATORY_TRACT | 0 refills | Status: DC | PRN

## 2018-04-20 MED ORDER — CALCITRIOL 0.5 MCG PO CAPS: 0.5000 ug | ORAL_CAPSULE | Freq: Every day | ORAL | 0 refills | Status: DC

## 2018-04-20 MED ORDER — SODIUM BICARBONATE 650 MG PO TABS: 1300.0000 mg | ORAL_TABLET | Freq: Three times a day (TID) | ORAL | 0 refills | Status: DC

## 2018-04-20 MED ORDER — OMEPRAZOLE 20 MG PO CPDR: 20.0000 mg | DELAYED_RELEASE_CAPSULE | Freq: Every day | ORAL | 0 refills | Status: DC

## 2018-04-20 NOTE — Progress Notes (Signed)
Location:   [facility] of Service:   SNF Provider: Hennie Duos MD    Extended Emergency Contact Information Primary Emergency Contact: [place], [place] Mobile Phone: [phone] Relation: Daughter Secondary Emergency Contact: Charyl Dancer [place] Mobile Phone: [phone] Relation: Daughter  No Known Allergies  Chief Complaint  Patient presents with  . Discharge Note    HPI:  84 y.o. male with stage V chronic kidney disease, hypertension, chronic dysphasia, who had undergone a left brachial fifth basilic fistula placement 2 days prior and was given a supraclavicular block.  After the block patient had left facial droop left facial numbness and inability to move his left arm.  Patient was admitted to [facility] from 9/30-10/2 where stroke was ruled out and patient was felt to be from his left brachial block.  Patient underwent a modified barium swallow demonstrated severe dysphasia with the recommendation that this person was not safe to consume any oral intake after discussion with patient he said he still enjoyed food and was going to stay on modified diet and would consider PEG if he got in trouble.  Patient is admitted to SNF for OT/PT and discharge then rehospitalized 2 weeks later from 10/20 8-30 for a planned peripheralization of his left radial basilic fistula there were no complications from the surgery and patient had a good thrill after the procedure.  Hospital course was complicated by hyperkalemia and elevated creatinine prior to discharge.  Patient was again admitted to skilled nursing facility for OT/PT and is now ready to be discharged to home.    Past Medical History:  Diagnosis Date  . ARF (acute renal failure) ([place])   . Bilateral hydronephrosis   . Chronic obstructive pulmonary disease (COPD) ([place])   . Essential hypertension   . GERD (gastroesophageal reflux disease)   . MRSA (methicillin resistant staph aureus)  culture positive   . Osteoarthritis of multiple joints   . Transfusion history    with hip surgery    Past Surgical History:  Procedure Laterality Date  . APPENDECTOMY     1950's  . HERNIA REPAIR     Inguinal  . TOTAL HIP ARTHROPLASTY Right      reports that he quit smoking about 15 years ago. His smoking use included cigarettes. He started smoking about 70 years ago. He has never used smokeless tobacco. His alcohol and drug histories are not on file. Social History   Socioeconomic History  . Marital status: Married    Spouse name: Not on file  . Number of children: Not on file  . Years of education: Not on file  . Highest education level: Not on file  Occupational History  . Not on file  Social Needs  . Financial resource strain: Not on file  . Food insecurity:    Worry: Not on file    Inability: Not on file  . Transportation needs:    Medical: Not on file    Non-medical: Not on file  Tobacco Use  . Smoking status: Former Smoker    Types: Cigarettes    Start date: 1949    Last attempt to quit: 2004    Years since quitting: 15.8  . Smokeless tobacco: Never Used  Substance and Sexual Activity  . Alcohol use: Not on file  . Drug use: Not on file  . Sexual activity: Not on file  Lifestyle  . Physical activity:    Days per week: Not on file  Minutes per session: Not on file  . Stress: Not on file  Relationships  . Social connections:    Talks on phone: Not on file    Gets together: Not on file    Attends religious service: Not on file    Active member of club or organization: Not on file    Attends meetings of clubs or organizations: Not on file    Relationship status: Not on file  . Intimate partner violence:    Fear of current or ex partner: Not on file    Emotionally abused: Not on file    Physically abused: Not on file    Forced sexual activity: Not on file  Other Topics Concern  . Not on file  Social History Narrative  . Not on file    Pertinent   Health Maintenance Due  Topic Date Due  . PNA vac Low Risk Adult (1 of 2 - PCV13) 04/20/1995  . INFLUENZA VACCINE  01/03/2018    Medications: Allergies as of 04/19/2018   No Known Allergies     Medication List        Accurate as of 04/19/18 11:59 PM. Always use your most recent med list.          acetaminophen 325 MG tablet Commonly known as:  TYLENOL Take 650 mg by mouth every 4 (four) hours as needed.   amLODipine 10 MG tablet Commonly known as:  NORVASC Take 10 mg by mouth daily.   calcitRIOL 0.5 MCG capsule Commonly known as:  ROCALTROL Take 0.5 mcg by mouth daily.   ipratropium-albuterol 0.5-2.5 (3) MG/3ML Soln Commonly known as:  DUONEB Take 3 mLs by nebulization.   omeprazole 20 MG capsule Commonly known as:  PRILOSEC Take 20 mg by mouth daily.   sodium bicarbonate 650 MG tablet Take 650 mg by mouth 4 (four) times daily.   traMADol 50 MG tablet Commonly known as:  ULTRAM Take by mouth every 6 (six) hours as needed.        Vitals:   04/20/18 1554  BP: 117/60  Pulse: 90  Resp: 17  Temp: 97.8 F (36.6 C)   There is no height or weight on file to calculate BMI.  Physical Exam  GENERAL APPEARANCE: Alert, conversant. No acute distress.  HEENT: Unremarkable. RESPIRATORY: Breathing is even, unlabored. Lung sounds are clear   CARDIOVASCULAR: Heart RRR no murmurs, rubs or gallops. No peripheral edema; thrill in left arm.  GASTROINTESTINAL: Abdomen is soft, non-tender, not distended w/ normal bowel sounds.  NEUROLOGIC: Cranial nerves 2-12 grossly intact. Moves all extremities   Labs reviewed: Basic Metabolic Panel: Recent Labs    03/21/18 04/08/18 04/13/18  NA 142 142 137  K 4.8 4.4 5.0  BUN 60* 49* 79*  CREATININE 2.8* 2.8* 3.2*   No results found for: [facility] Liver Function Tests: No results for input(s): AST, ALT, ALKPHOS, BILITOT, PROT, ALBUMIN in the last 8760 hours. No results for input(s): LIPASE, AMYLASE in the last 8760  hours. No results for input(s): AMMONIA in the last 8760 hours. CBC: Recent Labs    04/08/18 04/09/18 04/13/18  WBC 4.1 4.1 5.5  HGB 8.7* 8.5* 10.1*  HCT 26* 26* 32*  PLT 207 204 265   Lipid No results for input(s): CHOL, HDL, LDLCALC, TRIG in the last 8760 hours. Cardiac Enzymes: No results for input(s): CKTOTAL, CKMB, CKMBINDEX, TROPONINI in the last 8760 hours. BNP: No results for input(s): BNP in the last 8760 hours. CBG: No results for input(s):  GLUCAP in the last 8760 hours.  Procedures and Imaging Studies During Stay: No results found.  Assessment/Plan:   Chronic kidney disease, stage V (HCC)  Dysphasia  Hyperkalemia  Essential hypertension  Chronic obstructive pulmonary disease, unspecified COPD type ([place])   Patient is being discharged with the following home health services: PT/nursing  Patient is being discharged with the following durable medical equipment: None  Patient has been advised to f/u with their PCP in 1-2 weeks to bring them up to date on their rehab stay.  Social services at facility was responsible for arranging this appointment.  Pt was provided with a 30 day supply of prescriptions for medications and refills must be obtained from their PCP.  For controlled substances, a more limited supply may be provided adequate until PCP appointment only.  Medications have been reconciled.  Time spent greater than 30 minutes;> 50% of time with patient was spent reviewing records, labs, tests and studies, counseling and developing plan of care  Inocencio Homes, MD

## 2018-05-26 ENCOUNTER — Other Ambulatory Visit: Payer: Self-pay | Admitting: Internal Medicine

## 2018-05-26 DIAGNOSIS — I10 Essential (primary) hypertension: Secondary | ICD-10-CM

## 2018-08-09 ENCOUNTER — Non-Acute Institutional Stay (SKILLED_NURSING_FACILITY): Payer: Medicare HMO | Admitting: Internal Medicine

## 2018-08-09 ENCOUNTER — Encounter: Payer: Self-pay | Admitting: Internal Medicine

## 2018-08-09 DIAGNOSIS — M25562 Pain in left knee: Secondary | ICD-10-CM | POA: Diagnosis not present

## 2018-08-09 DIAGNOSIS — K8050 Calculus of bile duct without cholangitis or cholecystitis without obstruction: Secondary | ICD-10-CM

## 2018-08-09 DIAGNOSIS — K921 Melena: Secondary | ICD-10-CM

## 2018-08-09 DIAGNOSIS — N3000 Acute cystitis without hematuria: Secondary | ICD-10-CM

## 2018-08-09 DIAGNOSIS — I10 Essential (primary) hypertension: Secondary | ICD-10-CM

## 2018-08-09 DIAGNOSIS — G8929 Other chronic pain: Secondary | ICD-10-CM

## 2018-08-09 DIAGNOSIS — N319 Neuromuscular dysfunction of bladder, unspecified: Secondary | ICD-10-CM

## 2018-08-09 DIAGNOSIS — E872 Acidosis, unspecified: Secondary | ICD-10-CM

## 2018-08-09 DIAGNOSIS — N185 Chronic kidney disease, stage 5: Secondary | ICD-10-CM

## 2018-08-09 NOTE — Progress Notes (Signed)
:  Location:  [place] Room Number: [phone] Place of Service:  SNF (31)  Ray Delaine. Sheppard Coil, MD  Patient Care Team: Hennie Duos, MD as PCP - General (Internal Medicine)  Extended Emergency Contact Information Primary Emergency Contact: Ray, [place] Mobile Phone: [phone] Relation: Daughter Secondary Emergency Contact: Ray [place] Mobile Phone: [phone] Relation: Daughter     Allergies: Patient has no known allergies.  Chief Complaint  Patient presents with  . New Admit To SNF    Admit to [facility]    HPI: Patient is 85 y.o. male with stage IV renal failure, hypertension, BPH, neurogenic bladder, who presented to [facility] with complaints of blood in stool.  Patient said he had rectal bleed day prior, had an episode of nausea vomiting along with right lower quadrant abdominal pain.  Patient says his nausea and vomiting is improved but he is concerned about ongoing abdominal pain and thus came to the ED.  Patient also gives history of cough with clear sputum production, denies any fever chills or rigors.  Does give a history of burning with urination, states that he self catheterizes 2-3 times a day.  States he is never had a colonoscopy done, denies any history of NSAID use.  Patient had was Hemoccult positive in the emergency department.  Patient was admitted to [facility] from 2/26-3/5 where patient had a EGD with no evidence of bleed.  Hemoglobin remained stable.  He had a urinary tract infection and finished 6 days of Rocephin then 1 day of cefdinir, cultures did not show any growth.  All other medical problems were stable and patient is admitted to skilled nursing facility for OT/PT.  While at skilled nursing facility patient will be followed for stage IV chronic kidney disease treated with pro-Calcitrol and sodium bicarb, arthritis treated with Tylenol, and neurogenic bladder  treated with in and out catheterization.  Past Medical History:  Diagnosis Date  . ARF (acute renal failure) ([place])   . Bilateral hydronephrosis   . Chronic obstructive pulmonary disease (COPD) ([place])   . Essential hypertension   . GERD (gastroesophageal reflux disease)   . MRSA (methicillin resistant staph aureus) culture positive   . Osteoarthritis of multiple joints   . Transfusion history    with hip surgery    Past Surgical History:  Procedure Laterality Date  . APPENDECTOMY     1950's  . HERNIA REPAIR     Inguinal  . TOTAL HIP ARTHROPLASTY Right     Allergies as of 08/09/2018   No Known Allergies     Medication List       Accurate as of August 09, 2018  1:52 PM. Always use your most recent med list.        acetaminophen 325 MG tablet Commonly known as:  TYLENOL Take 650 mg by mouth every 6 (six) hours as needed.   calcitRIOL 0.5 MCG capsule Commonly known as:  ROCALTROL Take 1 capsule (0.5 mcg total) by mouth daily.   sodium bicarbonate 650 MG tablet Take 2 tablets (1,300 mg total) by mouth 3 (three) times daily.   tamsulosin 0.4 MG Caps capsule Commonly known as:  FLOMAX Take 0.4 mg by mouth daily.       No orders of the defined types were placed in this encounter.    There is no immunization history on file for this patient.  Social History   Tobacco Use  .  Smoking status: Former Smoker    Types: Cigarettes    Start date: 1949    Last attempt to quit: 2004    Years since quitting: 16.1  . Smokeless tobacco: Never Used  Substance Use Topics  . Alcohol use: Not on file    Family history is   Family History  Problem Relation Age of Onset  . Heart attack Father   . Cancer Brother       Review of Systems  DATA OBTAINED: from patient, nurse GENERAL:  no fevers, fatigue, appetite changes SKIN: No itching, or rash EYES: No eye pain, redness, discharge EARS: No earache, tinnitus, change in hearing NOSE: No congestion, drainage or  bleeding  MOUTH/THROAT: No mouth or tooth pain, No sore throat RESPIRATORY: No cough, wheezing, SOB CARDIAC: No chest pain, palpitations, lower extremity edema  GI: No abdominal pain, No N/V/D or constipation, No heartburn or reflux  GU: No dysuria, frequency or urgency, or incontinence  MUSCULOSKELETAL: No unrelieved bone/joint pain NEUROLOGIC: No headache, dizziness or focal weakness PSYCHIATRIC: No c/o anxiety or sadness   Vitals:   08/09/18 1344  BP: 111/66  Pulse: 81  Resp: 19  Temp: (!) 96.8 F (36 C)    SpO2 Readings from Last 1 Encounters:  03/07/18 97%   Body mass index is 22.29 kg/m.     Physical Exam  GENERAL APPEARANCE: Alert, conversant,  No acute distress.  SKIN: No diaphoresis rash HEAD: Normocephalic, atraumatic  EYES: Conjunctiva/lids clear. Pupils round, reactive. EOMs intact.  EARS: External exam WNL, canals clear. Hearing grossly normal.  NOSE: No deformity or discharge.  MOUTH/THROAT: Lips w/o lesions  RESPIRATORY: Breathing is even, unlabored. Lung sounds are clear   CARDIOVASCULAR: Heart RRR no murmurs, rubs or gallops. No peripheral edema.   GASTROINTESTINAL: Abdomen is soft, non-tender, not distended w/ normal bowel sounds. GENITOURINARY: Bladder non tender, not distended  MUSCULOSKELETAL: No abnormal joints or musculature NEUROLOGIC:  Cranial nerves 2-12 grossly intact. Moves all extremities  PSYCHIATRIC: Mood and affect appropriate to situation, no behavioral issues  Patient Active Problem List   Diagnosis Date Noted  . Hyperkalemia 04/07/2018  . Chronic obstructive pulmonary disease (COPD) ([place]) 04/07/2018  . Facial muscle weakness 03/17/2018  . Paresthesia of left arm 03/17/2018  . Brachial plexus neuralgia 03/17/2018  . Chronic kidney disease, stage V ([place]) 03/17/2018  . Essential hypertension 03/17/2018  . Bilateral hydronephrosis 03/17/2018  . Dysphasia 03/17/2018  . GERD (gastroesophageal reflux disease) 03/17/2018  .  Hypercalcemia 03/17/2018  . Metabolic acidosis 53/97/6734      Labs reviewed: Basic Metabolic Panel:    Component Value Date/Time   NA 137 04/13/2018   K 5.0 04/13/2018   BUN 79 (A) 04/13/2018   CREATININE 3.2 (A) 04/13/2018    Recent Labs    03/21/18 04/08/18 04/13/18  NA 142 142 137  K 4.8 4.4 5.0  BUN 60* 49* 79*  CREATININE 2.8* 2.8* 3.2*   Liver Function Tests: No results for input(s): AST, ALT, ALKPHOS, BILITOT, PROT, ALBUMIN in the last 8760 hours. No results for input(s): LIPASE, AMYLASE in the last 8760 hours. No results for input(s): AMMONIA in the last 8760 hours. CBC: Recent Labs    04/08/18 04/09/18 04/13/18  WBC 4.1 4.1 5.5  HGB 8.7* 8.5* 10.1*  HCT 26* 26* 32*  PLT 207 204 265   Lipid No results for input(s): CHOL, HDL, LDLCALC, TRIG in the last 8760 hours.  Cardiac Enzymes: No results for input(s): CKTOTAL, CKMB, CKMBINDEX, TROPONINI in the  last 8760 hours. BNP: No results for input(s): BNP in the last 8760 hours. No results found for: MICROALBUR No results found for: HGBA1C No results found for: TSH No results found for: VITAMINB12 No results found for: FOLATE No results found for: IRON, TIBC, FERRITIN  Imaging and Procedures obtained prior to SNF admission: Dg Chest 2 View  Result Date: 08/15/2010 *RADIOLOGY REPORT* Clinical Data: Preop for prostate surgery.  Chest pain with shortness of breath. CHEST - 2 VIEW Comparison: None. Findings: Heart size normal.  Lungs mildly hyperaerated but clear. No congestive heart failure.  There are old bilateral rib fractures. IMPRESSION: No active disease.  The lungs are mildly hyperaerated. Original Report Authenticated By: Juanita Laster, M.D.    Not all labs, radiology exams or other studies done during hospitalization come through on my EPIC note; however they are reviewed by me.    Assessment and Plan  Melena- no further episodes of rectal bleed; GI evaluated and an EEG showed no evidence of  bleed.  Patient was on PPI while inpatient but with normal gastric mucosa on EGD that would not be continued.  GI signed off and hemoglobin remained stable SNF- admitted for OT/PT; patient's discharge hemoglobin was 9.7, patient was taken off of iron; will follow-up CBC  Common bile duct stones- MRCP revealed small CBD stones LFTs are normal patient not having abdominal pain and is tolerating diet recommendation is to follow-up with GI as outpatient in 1 to 2 weeks  Left knee pain and swelling- patient with history of gout, Ortho unable to aspirate; probable osteoarthritis SNF- continue treat osteoarthritis with Tylenol  Urinary tract infection-patient finished 6 days of Rocephin and 1 day of Ceftin ear; culture did not show any growth SNF- nothing further is required  Hypertension-patient was on amlodipine 5 mg daily, his blood pressure was low in the hospital so it was DC'd  Neurogenic bladder SNF- continue in and out caths; patient was on Flomax but that was DC'd to the hospital  Chronic kidney disease stage IV SNF- continue sodium bicarb for metabolic acidosis and Calcitrol for hypercalcemia   Time spent greater than 45 minutes;> 50% of time with patient was spent reviewing records, labs, tests and studies, counseling and developing plan of care  Webb Silversmith D. Sheppard Coil, MD

## 2018-08-11 ENCOUNTER — Encounter: Payer: Self-pay | Admitting: Internal Medicine

## 2018-08-11 DIAGNOSIS — N319 Neuromuscular dysfunction of bladder, unspecified: Secondary | ICD-10-CM | POA: Insufficient documentation

## 2018-08-11 DIAGNOSIS — K921 Melena: Secondary | ICD-10-CM | POA: Insufficient documentation

## 2018-08-11 DIAGNOSIS — N390 Urinary tract infection, site not specified: Secondary | ICD-10-CM | POA: Insufficient documentation

## 2018-08-11 DIAGNOSIS — M25562 Pain in left knee: Secondary | ICD-10-CM | POA: Insufficient documentation

## 2018-08-11 DIAGNOSIS — K8050 Calculus of bile duct without cholangitis or cholecystitis without obstruction: Secondary | ICD-10-CM | POA: Insufficient documentation

## 2018-08-27 ENCOUNTER — Encounter: Payer: Self-pay | Admitting: Adult Health

## 2018-08-27 ENCOUNTER — Non-Acute Institutional Stay (SKILLED_NURSING_FACILITY): Payer: Medicare HMO | Admitting: Adult Health

## 2018-08-27 DIAGNOSIS — M10341 Gout due to renal impairment, right hand: Secondary | ICD-10-CM

## 2018-08-27 DIAGNOSIS — N185 Chronic kidney disease, stage 5: Secondary | ICD-10-CM

## 2018-08-27 NOTE — Progress Notes (Signed)
Location:    [facility] Room Number: [phone] Place of Service:  SNF (31)   CODE STATUS: full code   No Known Allergies  Chief Complaint  Patient presents with  . Acute Visit    Gout in right hand    HPI:  His right hand is red hot swollen and painful. No reports of fevers present. He has a history of gout and stage V chronic kidney disease. No reports of injury present   Past Medical History:  Diagnosis Date  . ARF (acute renal failure) ([place])   . Bilateral hydronephrosis   . Chronic obstructive pulmonary disease (COPD) ([place])   . Essential hypertension   . GERD (gastroesophageal reflux disease)   . MRSA (methicillin resistant staph aureus) culture positive   . Osteoarthritis of multiple joints   . Transfusion history    with hip surgery    Past Surgical History:  Procedure Laterality Date  . APPENDECTOMY     1950's  . HERNIA REPAIR     Inguinal  . TOTAL HIP ARTHROPLASTY Right     Social History   Socioeconomic History  . Marital status: Married    Spouse name: Not on file  . Number of children: Not on file  . Years of education: Not on file  . Highest education level: Not on file  Occupational History  . Not on file  Social Needs  . Financial resource strain: Not on file  . Food insecurity:    Worry: Not on file    Inability: Not on file  . Transportation needs:    Medical: Not on file    Non-medical: Not on file  Tobacco Use  . Smoking status: Former Smoker    Types: Cigarettes    Start date: 1949    Last attempt to quit: 2004    Years since quitting: 16.2  . Smokeless tobacco: Never Used  Substance and Sexual Activity  . Alcohol use: Not on file  . Drug use: Not on file  . Sexual activity: Not on file  Lifestyle  . Physical activity:    Days per week: Not on file    Minutes per session: Not on file  . Stress: Not on file  Relationships  . Social connections:    Talks on phone: Not on file    Gets  together: Not on file    Attends religious service: Not on file    Active member of club or organization: Not on file    Attends meetings of clubs or organizations: Not on file    Relationship status: Not on file  . Intimate partner violence:    Fear of current or ex partner: Not on file    Emotionally abused: Not on file    Physically abused: Not on file    Forced sexual activity: Not on file  Other Topics Concern  . Not on file  Social History Narrative  . Not on file   Family History  Problem Relation Age of Onset  . Heart attack Father   . Cancer Brother       VITAL SIGNS BP (!) 148/73   Pulse 79   Temp (!) 96.8 F (36 C)   Resp 18   Ht 5\' 11"  (1.803 m)   Wt 154 lb 6.4 oz (70 kg)   BMI 21.53 kg/m   Outpatient Encounter Medications as of 08/27/2018  Medication Sig  . acetaminophen (TYLENOL) 325 MG tablet Take 650 mg  by mouth every 6 (six) hours as needed.   . calcitRIOL (ROCALTROL) 0.5 MCG capsule Take 1 capsule (0.5 mcg total) by mouth daily.  . sodium bicarbonate 650 MG tablet Take 2 tablets (1,300 mg total) by mouth 3 (three) times daily.  . tamsulosin (FLOMAX) 0.4 MG CAPS capsule Take 0.4 mg by mouth daily.   No facility-administered encounter medications on file as of 08/27/2018.      SIGNIFICANT DIAGNOSTIC EXAMS  LABS REVIEWED TODAY:   08-20-18: wbc 5.5; hgb 9.8; hct 29.7; mcv 91.1 plt 205; glucose 124; bun 74.5 creat 2.96; k+ 5.4; na++ 142; ca 9.4  08-22-18: glucose 104; bun 65.9; creat 2.65; k+ 4.5; na++144 ca 9.4  Review of Systems  Constitutional: Negative for malaise/fatigue.  Respiratory: Negative for cough and shortness of breath.   Cardiovascular: Negative for chest pain, palpitations and leg swelling.  Gastrointestinal: Negative for abdominal pain, constipation and heartburn.  Musculoskeletal: Positive for joint pain. Negative for back pain and myalgias.       Right hand pain and swelling   Skin: Negative.   Neurological: Negative for  dizziness.  Psychiatric/Behavioral: The patient is not nervous/anxious.     Physical Exam Constitutional:      General: He is not in acute distress.    Appearance: He is well-developed. He is not diaphoretic.  Neck:     Musculoskeletal: Neck supple.     Thyroid: No thyromegaly.  Cardiovascular:     Rate and Rhythm: Normal rate and regular rhythm.     Pulses: Normal pulses.     Heart sounds: Normal heart sounds.  Pulmonary:     Effort: Pulmonary effort is normal. No respiratory distress.     Breath sounds: Normal breath sounds.  Abdominal:     General: Bowel sounds are normal. There is no distension.     Palpations: Abdomen is soft.     Tenderness: There is no abdominal tenderness.  Musculoskeletal: Normal range of motion.        General: Swelling present.     Right lower leg: No edema.     Left lower leg: No edema.     Comments: Right hand is red hot swollen and painful to touch   Lymphadenopathy:     Cervical: No cervical adenopathy.  Skin:    General: Skin is warm and dry.  Neurological:     Mental Status: He is alert. Mental status is at baseline.  Psychiatric:        Mood and Affect: Mood normal.      ASSESSMENT/ PLAN:  TODAY:   1. Acute gout due to renal impairment involving right hand: is worse: will get uric acid level; will begin prednisone 10 mg twice daily for 10 days; and will begin uloric 20 mg daily   MD is aware of resident's narcotic use and is in agreement with current plan of care. We will attempt to wean resident as apropriate   Ok Edwards NP [facility]  Contact [phone] Monday through Friday 8am- 5pm  After hours call [phone]

## 2018-08-28 ENCOUNTER — Encounter: Payer: Self-pay | Admitting: Adult Health

## 2018-08-28 ENCOUNTER — Other Ambulatory Visit: Payer: Self-pay | Admitting: Adult Health

## 2018-08-28 ENCOUNTER — Non-Acute Institutional Stay (SKILLED_NURSING_FACILITY): Payer: Medicare HMO | Admitting: Adult Health

## 2018-08-28 DIAGNOSIS — M10341 Gout due to renal impairment, right hand: Secondary | ICD-10-CM

## 2018-08-28 DIAGNOSIS — J449 Chronic obstructive pulmonary disease, unspecified: Secondary | ICD-10-CM | POA: Diagnosis not present

## 2018-08-28 DIAGNOSIS — N185 Chronic kidney disease, stage 5: Secondary | ICD-10-CM

## 2018-08-28 DIAGNOSIS — K921 Melena: Secondary | ICD-10-CM | POA: Diagnosis not present

## 2018-08-28 MED ORDER — PREDNISONE 10 MG PO TABS: 10.0000 mg | ORAL_TABLET | Freq: Two times a day (BID) | ORAL | 0 refills | Status: AC

## 2018-08-28 MED ORDER — FEBUXOSTAT 40 MG PO TABS: 20.0000 mg | ORAL_TABLET | Freq: Every day | ORAL | 0 refills | Status: AC

## 2018-08-28 MED ORDER — SODIUM BICARBONATE 650 MG PO TABS: 1300.0000 mg | ORAL_TABLET | Freq: Three times a day (TID) | ORAL | 0 refills | Status: AC

## 2018-08-28 MED ORDER — CALCITRIOL 0.5 MCG PO CAPS: 0.5000 ug | ORAL_CAPSULE | Freq: Every day | ORAL | 0 refills | Status: AC

## 2018-08-28 MED ORDER — TAMSULOSIN HCL 0.4 MG PO CAPS: 0.4000 mg | ORAL_CAPSULE | Freq: Every day | ORAL | 0 refills | Status: DC

## 2018-08-28 NOTE — Progress Notes (Addendum)
Location:    [place] Room Number: [phone] Place of Service:  SNF (31)    CODE STATUS: full code   No Known Allergies  Chief Complaint  Patient presents with  . Discharge Note    Discharge from [facility]    HPI:  He is being discharged to home with home health for pt/ot/rn. He will need a front wheel walker. He will need his prescriptions written and will need to follow up with his medical provider.  He had been hospitalized for bloody stools. He was admitted to this facility for short term rehab and is now ready to complete therapy on a home health basis.     Past Medical History:  Diagnosis Date  . ARF (acute renal failure) ([place])   . Bilateral hydronephrosis   . Chronic obstructive pulmonary disease (COPD) ([place])   . Essential hypertension   . GERD (gastroesophageal reflux disease)   . MRSA (methicillin resistant staph aureus) culture positive   . Osteoarthritis of multiple joints   . Transfusion history    with hip surgery    Past Surgical History:  Procedure Laterality Date  . APPENDECTOMY     1950's  . HERNIA REPAIR     Inguinal  . TOTAL HIP ARTHROPLASTY Right     Social History   Socioeconomic History  . Marital status: Married    Spouse name: Not on file  . Number of children: Not on file  . Years of education: Not on file  . Highest education level: Not on file  Occupational History  . Not on file  Social Needs  . Financial resource strain: Not on file  . Food insecurity:    Worry: Not on file    Inability: Not on file  . Transportation needs:    Medical: Not on file    Non-medical: Not on file  Tobacco Use  . Smoking status: Former Smoker    Types: Cigarettes    Start date: 1949    Last attempt to quit: 2004    Years since quitting: 16.2  . Smokeless tobacco: Never Used  Substance and Sexual Activity  . Alcohol use: Not on file  . Drug use: Not on file  . Sexual activity: Not on file  Lifestyle   . Physical activity:    Days per week: Not on file    Minutes per session: Not on file  . Stress: Not on file  Relationships  . Social connections:    Talks on phone: Not on file    Gets together: Not on file    Attends religious service: Not on file    Active member of club or organization: Not on file    Attends meetings of clubs or organizations: Not on file    Relationship status: Not on file  . Intimate partner violence:    Fear of current or ex partner: Not on file    Emotionally abused: Not on file    Physically abused: Not on file    Forced sexual activity: Not on file  Other Topics Concern  . Not on file  Social History Narrative  . Not on file   Family History  Problem Relation Age of Onset  . Heart attack Father   . Cancer Brother     VITAL SIGNS BP 109/61   Pulse 90   Temp (!) 97.1 F (36.2 C)   Resp 18   Ht 5\' 11"  (1.803 m)  Wt 154 lb 6.4 oz (70 kg)   BMI 21.53 kg/m   Patient's Medications  New Prescriptions   No medications on file  Previous Medications   ACETAMINOPHEN (TYLENOL) 325 MG TABLET    Take 650 mg by mouth every 6 (six) hours as needed.    CALCITRIOL (ROCALTROL) 0.5 MCG CAPSULE    Take 1 capsule (0.5 mcg total) by mouth daily.   FEBUXOSTAT (ULORIC) 40 MG TABLET    40 mg. Give 1/2 tablet ( 20 mg ) by mouth daily for Gout   PREDNISONE (DELTASONE) 10 MG TABLET    10 mg. Give 1 tablet by mouth twice daily for 10 days for gout flare   SODIUM BICARBONATE 650 MG TABLET    Take 2 tablets (1,300 mg total) by mouth 3 (three) times daily.   TAMSULOSIN (FLOMAX) 0.4 MG CAPS CAPSULE    Take 0.4 mg by mouth daily.  Modified Medications   No medications on file  Discontinued Medications   No medications on file     SIGNIFICANT DIAGNOSTIC EXAMS   LABS REVIEWED PREVIOUS:   08-20-18: wbc 5.5; hgb 9.8; hct 29.7; mcv 91.1 plt 205; glucose 124; bun 74.5 creat 2.96; k+ 5.4; na++ 142; ca 9.4  08-22-18: glucose 104; bun 65.9; creat 2.65; k+ 4.5; na++144 ca  9.4  NO NEW LABS.   Review of Systems  Constitutional: Negative for malaise/fatigue.  Respiratory: Negative for cough and shortness of breath.   Cardiovascular: Negative for chest pain, palpitations and leg swelling.  Gastrointestinal: Negative for abdominal pain, constipation and heartburn.  Musculoskeletal: Positive for joint pain. Negative for back pain and myalgias.       Right hand pain and swelling slightly better   Skin: Negative.   Neurological: Negative for dizziness.  Psychiatric/Behavioral: The patient is not nervous/anxious.       Physical Exam Constitutional:      General: He is not in acute distress.    Appearance: He is well-developed. He is not diaphoretic.  Neck:     Musculoskeletal: Neck supple.     Thyroid: No thyromegaly.  Cardiovascular:     Rate and Rhythm: Normal rate and regular rhythm.     Pulses: Normal pulses.     Heart sounds: Normal heart sounds.  Pulmonary:     Effort: Pulmonary effort is normal. No respiratory distress.     Breath sounds: Normal breath sounds.  Abdominal:     General: Bowel sounds are normal. There is no distension.     Palpations: Abdomen is soft.     Tenderness: There is no abdominal tenderness.  Musculoskeletal: Normal range of motion.        General: Swelling present.     Right lower leg: No edema.     Left lower leg: No edema.     Comments:  Right hand is red hot swollen and painful to touch  Is slightly improved    Lymphadenopathy:     Cervical: No cervical adenopathy.  Skin:    General: Skin is warm and dry.  Neurological:     Mental Status: He is alert. Mental status is at baseline.  Psychiatric:        Mood and Affect: Mood normal.      ASSESSMENT/ PLAN:   Patient is being discharged with the following home health services:  Pt/ot/rn: to evaluate and treat as indicated for gait balance strength adl training medication management   Patient is being discharged with the following durable medical equipment:  Front wheel walker to allow him to maintain his current level of independence with his adls   Patient has been advised to f/u with their PCP in 1-2 weeks to bring them up to date on their rehab stay.  Social services at facility was responsible for arranging this appointment.  Pt was provided with a 30 day supply of prescriptions for medications and refills must be obtained from their PCP.  For controlled substances, a more limited supply may be provided adequate until PCP appointment only.   A 30 day supply of his prescription medications have been sent to [facility].   Time spent with patient and discharge process 35 minutes: dme; medications; home health.    He will need speech therapy to evaluate and treat as indicated for dysphagia.  His uric acid level is 8.5     Ok Edwards NP [facility]  Contact [phone] Monday through Friday 8am- 5pm  After hours call [phone]

## 2018-10-13 ENCOUNTER — Other Ambulatory Visit: Payer: Self-pay | Admitting: Adult Health

## 2018-10-13 DIAGNOSIS — N185 Chronic kidney disease, stage 5: Secondary | ICD-10-CM

## 2018-11-03 ENCOUNTER — Other Ambulatory Visit: Payer: Self-pay | Admitting: Adult Health

## 2018-11-03 DIAGNOSIS — N185 Chronic kidney disease, stage 5: Secondary | ICD-10-CM

## 2019-01-08 ENCOUNTER — Emergency Department (HOSPITAL_COMMUNITY): Payer: Medicare HMO

## 2019-01-08 ENCOUNTER — Other Ambulatory Visit: Payer: Self-pay

## 2019-01-08 ENCOUNTER — Encounter (HOSPITAL_COMMUNITY): Payer: Self-pay

## 2019-01-08 ENCOUNTER — Inpatient Hospital Stay (HOSPITAL_COMMUNITY)
Admission: EM | Admit: 2019-01-08 | Discharge: 2019-01-14 | DRG: 312 | Disposition: A | Discharge status: Home Health | Payer: Medicare HMO | Attending: Internal Medicine | Admitting: Internal Medicine

## 2019-01-08 DIAGNOSIS — I10 Essential (primary) hypertension: Secondary | ICD-10-CM | POA: Diagnosis present

## 2019-01-08 DIAGNOSIS — R55 Syncope and collapse: Secondary | ICD-10-CM | POA: Diagnosis present

## 2019-01-08 DIAGNOSIS — N185 Chronic kidney disease, stage 5: Secondary | ICD-10-CM | POA: Diagnosis not present

## 2019-01-08 DIAGNOSIS — Z66 Do not resuscitate: Secondary | ICD-10-CM | POA: Diagnosis present

## 2019-01-08 DIAGNOSIS — M159 Polyosteoarthritis, unspecified: Secondary | ICD-10-CM | POA: Diagnosis present

## 2019-01-08 DIAGNOSIS — R4020 Unspecified coma: Secondary | ICD-10-CM | POA: Diagnosis not present

## 2019-01-08 DIAGNOSIS — E8770 Fluid overload, unspecified: Secondary | ICD-10-CM | POA: Diagnosis not present

## 2019-01-08 DIAGNOSIS — Z79899 Other long term (current) drug therapy: Secondary | ICD-10-CM

## 2019-01-08 DIAGNOSIS — I951 Orthostatic hypotension: Principal | ICD-10-CM | POA: Diagnosis present

## 2019-01-08 DIAGNOSIS — N3090 Cystitis, unspecified without hematuria: Secondary | ICD-10-CM | POA: Diagnosis present

## 2019-01-08 DIAGNOSIS — Z20828 Contact with and (suspected) exposure to other viral communicable diseases: Secondary | ICD-10-CM | POA: Diagnosis present

## 2019-01-08 DIAGNOSIS — R338 Other retention of urine: Secondary | ICD-10-CM | POA: Diagnosis present

## 2019-01-08 DIAGNOSIS — K219 Gastro-esophageal reflux disease without esophagitis: Secondary | ICD-10-CM | POA: Diagnosis present

## 2019-01-08 DIAGNOSIS — D638 Anemia in other chronic diseases classified elsewhere: Secondary | ICD-10-CM | POA: Diagnosis present

## 2019-01-08 DIAGNOSIS — B965 Pseudomonas (aeruginosa) (mallei) (pseudomallei) as the cause of diseases classified elsewhere: Secondary | ICD-10-CM | POA: Diagnosis present

## 2019-01-08 DIAGNOSIS — J449 Chronic obstructive pulmonary disease, unspecified: Secondary | ICD-10-CM | POA: Diagnosis present

## 2019-01-08 DIAGNOSIS — Z8249 Family history of ischemic heart disease and other diseases of the circulatory system: Secondary | ICD-10-CM

## 2019-01-08 DIAGNOSIS — Z8614 Personal history of Methicillin resistant Staphylococcus aureus infection: Secondary | ICD-10-CM

## 2019-01-08 DIAGNOSIS — Z809 Family history of malignant neoplasm, unspecified: Secondary | ICD-10-CM

## 2019-01-08 DIAGNOSIS — I120 Hypertensive chronic kidney disease with stage 5 chronic kidney disease or end stage renal disease: Secondary | ICD-10-CM | POA: Diagnosis present

## 2019-01-08 DIAGNOSIS — Z87891 Personal history of nicotine dependence: Secondary | ICD-10-CM

## 2019-01-08 DIAGNOSIS — N319 Neuromuscular dysfunction of bladder, unspecified: Secondary | ICD-10-CM | POA: Diagnosis present

## 2019-01-08 DIAGNOSIS — N390 Urinary tract infection, site not specified: Secondary | ICD-10-CM

## 2019-01-08 DIAGNOSIS — Z96641 Presence of right artificial hip joint: Secondary | ICD-10-CM | POA: Diagnosis present

## 2019-01-08 LAB — CBC WITH DIFFERENTIAL/PLATELET
Abs Immature Granulocytes: 0.02 10*3/uL (ref 0.00–0.07)
Basophils Absolute: 0 10*3/uL (ref 0.0–0.1)
Basophils Relative: 0 %
Eosinophils Absolute: 0.1 10*3/uL (ref 0.0–0.5)
Eosinophils Relative: 2 %
HCT: 36.9 % — ABNORMAL LOW (ref 39.0–52.0)
Hemoglobin: 11.1 g/dL — ABNORMAL LOW (ref 13.0–17.0)
Immature Granulocytes: 0 %
Lymphocytes Relative: 15 %
Lymphs Abs: 0.8 10*3/uL (ref 0.7–4.0)
MCH: 29.2 pg (ref 26.0–34.0)
MCHC: 30.1 g/dL (ref 30.0–36.0)
MCV: 97.1 fL (ref 80.0–100.0)
Monocytes Absolute: 0.3 10*3/uL (ref 0.1–1.0)
Monocytes Relative: 5 %
Neutro Abs: 4.1 10*3/uL (ref 1.7–7.7)
Neutrophils Relative %: 78 %
Platelets: 188 10*3/uL (ref 150–400)
RBC: 3.8 MIL/uL — ABNORMAL LOW (ref 4.22–5.81)
RDW: 13.7 % (ref 11.5–15.5)
WBC: 5.4 10*3/uL (ref 4.0–10.5)
nRBC: 0 % (ref 0.0–0.2)

## 2019-01-08 LAB — MAGNESIUM: Magnesium: 2 mg/dL (ref 1.7–2.4)

## 2019-01-08 LAB — COMPREHENSIVE METABOLIC PANEL
ALT: 10 U/L (ref 0–44)
AST: 20 U/L (ref 15–41)
Albumin: 3.6 g/dL (ref 3.5–5.0)
Alkaline Phosphatase: 46 U/L (ref 38–126)
Anion gap: 10 (ref 5–15)
BUN: 46 mg/dL — ABNORMAL HIGH (ref 8–23)
CO2: 20 mmol/L — ABNORMAL LOW (ref 22–32)
Calcium: 10.1 mg/dL (ref 8.9–10.3)
Chloride: 108 mmol/L (ref 98–111)
Creatinine, Ser: 3.49 mg/dL — ABNORMAL HIGH (ref 0.61–1.24)
GFR calc Af Amer: 17 mL/min — ABNORMAL LOW (ref >60)
GFR calc non Af Amer: 15 mL/min — ABNORMAL LOW (ref >60)
Glucose, Bld: 104 mg/dL — ABNORMAL HIGH (ref 70–99)
Potassium: 5.1 mmol/L (ref 3.5–5.1)
Sodium: 138 mmol/L (ref 135–145)
Total Bilirubin: 0.7 mg/dL (ref 0.3–1.2)
Total Protein: 8.1 g/dL (ref 6.5–8.1)

## 2019-01-08 LAB — TROPONIN I (HIGH SENSITIVITY)
Troponin I (High Sensitivity): 20 ng/L — ABNORMAL HIGH (ref <18)
Troponin I (High Sensitivity): 19 ng/L — ABNORMAL HIGH (ref <18)

## 2019-01-08 LAB — CBG MONITORING, ED: Glucose-Capillary: 101 mg/dL — ABNORMAL HIGH (ref 70–99)

## 2019-01-08 MED ORDER — DOCUSATE SODIUM 100 MG PO CAPS
100.00 | ORAL_CAPSULE | ORAL | Status: DC
Start: ? — End: 2019-01-08

## 2019-01-08 MED ORDER — SODIUM BICARBONATE 650 MG PO TABS
1300.00 | ORAL_TABLET | ORAL | Status: DC
Start: 2019-01-07 — End: 2019-01-08

## 2019-01-08 MED ORDER — CALCITRIOL 0.5 MCG PO CAPS
0.5000 ug | ORAL_CAPSULE | Freq: Every day | ORAL | Status: DC
  Administered 2019-01-08 – 2019-01-14 (×7): 0.5 ug via ORAL
  Filled 2019-01-08 (×7): qty 1

## 2019-01-08 MED ORDER — GENERIC EXTERNAL MEDICATION
5.00 | Status: DC
Start: ? — End: 2019-01-08

## 2019-01-08 MED ORDER — SODIUM BICARBONATE 650 MG PO TABS
1300.0000 mg | ORAL_TABLET | Freq: Three times a day (TID) | ORAL | Status: DC
  Administered 2019-01-08 – 2019-01-14 (×17): 1300 mg via ORAL
  Filled 2019-01-08 (×17): qty 2

## 2019-01-08 MED ORDER — ACETAMINOPHEN 325 MG PO TABS
650.00 | ORAL_TABLET | ORAL | Status: DC
Start: ? — End: 2019-01-08

## 2019-01-08 MED ORDER — CALCITRIOL 0.25 MCG PO CAPS
.50 | ORAL_CAPSULE | ORAL | Status: DC
Start: 2019-01-08 — End: 2019-01-08

## 2019-01-08 MED ORDER — MAGNESIUM OXIDE 400 MG PO TABS
400.00 | ORAL_TABLET | ORAL | Status: DC
Start: 2019-01-07 — End: 2019-01-08

## 2019-01-08 MED ORDER — HEPARIN SODIUM (PORCINE) 5000 UNIT/ML IJ SOLN
5000.00 | INTRAMUSCULAR | Status: DC
Start: 2019-01-07 — End: 2019-01-08

## 2019-01-08 MED ORDER — SODIUM CHLORIDE 0.9 % IV SOLN
INTRAVENOUS | Status: DC
Start: ? — End: 2019-01-08

## 2019-01-08 MED ORDER — ONDANSETRON 4 MG PO TBDP
4.00 | ORAL_TABLET | ORAL | Status: DC
Start: ? — End: 2019-01-08

## 2019-01-08 MED ORDER — ACETAMINOPHEN 325 MG PO TABS
650.0000 mg | ORAL_TABLET | Freq: Four times a day (QID) | ORAL | Status: DC | PRN
  Administered 2019-01-12 – 2019-01-13 (×2): 650 mg via ORAL
  Filled 2019-01-08 (×2): qty 2

## 2019-01-08 MED ORDER — TAMSULOSIN HCL 0.4 MG PO CAPS
.40 | ORAL_CAPSULE | ORAL | Status: DC
Start: 2019-01-08 — End: 2019-01-08

## 2019-01-08 MED ORDER — ONDANSETRON HCL 4 MG/2ML IJ SOLN: 4.0000 mg | Freq: Four times a day (QID) | INTRAMUSCULAR | Status: DC | PRN

## 2019-01-08 MED ORDER — ONDANSETRON HCL 4 MG PO TABS: 4.0000 mg | ORAL_TABLET | Freq: Four times a day (QID) | ORAL | Status: DC | PRN

## 2019-01-08 MED ORDER — FEBUXOSTAT 40 MG PO TABS
20.0000 mg | ORAL_TABLET | Freq: Every day | ORAL | Status: DC
  Administered 2019-01-09 – 2019-01-14 (×6): 20 mg via ORAL
  Filled 2019-01-08 (×6): qty 1

## 2019-01-08 MED ORDER — HEPARIN SODIUM (PORCINE) 5000 UNIT/ML IJ SOLN
5000.0000 [IU] | Freq: Three times a day (TID) | INTRAMUSCULAR | Status: DC
  Administered 2019-01-08 – 2019-01-14 (×17): 5000 [IU] via SUBCUTANEOUS
  Filled 2019-01-08 (×17): qty 1

## 2019-01-08 MED ORDER — GENERIC EXTERNAL MEDICATION
5.00 | Status: DC
Start: 2019-01-07 — End: 2019-01-08

## 2019-01-08 MED ORDER — HYDRALAZINE HCL 10 MG PO TABS
10.00 | ORAL_TABLET | ORAL | Status: DC
Start: ? — End: 2019-01-08

## 2019-01-08 MED ORDER — LORAZEPAM 2 MG/ML IJ SOLN: 4.0000 mg | INTRAMUSCULAR | Status: DC | PRN

## 2019-01-08 MED ORDER — ALLOPURINOL 100 MG PO TABS
100.00 | ORAL_TABLET | ORAL | Status: DC
Start: 2019-01-08 — End: 2019-01-08

## 2019-01-08 MED ORDER — ZOLPIDEM TARTRATE 5 MG PO TABS
5.00 | ORAL_TABLET | ORAL | Status: DC
Start: ? — End: 2019-01-08

## 2019-01-08 MED ORDER — BENZONATATE 100 MG PO CAPS
100.00 | ORAL_CAPSULE | ORAL | Status: DC
Start: ? — End: 2019-01-08

## 2019-01-08 NOTE — H&P (Addendum)
History and Physical    Ray Howard FIE:[id number] DOB: [birth year] DOA: 01/08/2019  Referring MD/NP/PA: EDP PCP:  Patient coming from: Home  Chief Complaint: Passed out  HPI: Ray Howard is a 85 y.o. male with medical history significant for stage 5 chronic kidney disease, not on dialysis yet followed by nephrology yet [place], COPD, anemia of chronic disease, neurogenic bladder with urinary retention requiring self-catheterization, was just discharged from [facility] yesterday after admission for hypotension, acute kidney injury and hyperkalemia.  Patient's baseline creatinine is around 3, he was hospitalized a week ago with a creatinine of 5.3, potassium of 6 and hypotension, he was treated with IV fluids, sodium bicarbonate, followed by nephrology, renal ultrasound did not show hydronephrosis, he was also found to have Pseudomonas in his urine culture which was felt to be a contaminant by infectious disease, ultimately his creatinine improved to 3.0 and he was discharged home yesterday early afternoon. -Patient reports feeling okay at discharge he lives with his wife, he went to bed last night, woke up this morning and felt faint, walked to the bathroom and remembers sitting down on his commode, does not recall the events after this, subsequently found down on the bathroom floor by family unresponsive, EMS was called patient was found to be tachycardic with a thready pulse and decreased respiration, subsequently regained his consciousness and was brought to ED. there was reported incontinence of stool on the bathroom floor. -Patient denies any complaints at this time, he denies any fevers or chills, no dyspnea chest pain or shortness of breath no palpitations no nausea vomiting or diarrhea  ED Course: Blood pressure was 103/58 on arrival, rest of vital signs were stable, labs noted stage IV-V kidney disease otherwise unremarkable labs  Review of Systems: As per HPI  otherwise 14 point review of systems negative.   Past Medical History:  Diagnosis Date  . ARF (acute renal failure) ([place])   . Bilateral hydronephrosis   . Chronic obstructive pulmonary disease (COPD) ([place])   . Essential hypertension   . GERD (gastroesophageal reflux disease)   . MRSA (methicillin resistant staph aureus) culture positive   . Osteoarthritis of multiple joints   . Transfusion history    with hip surgery    Past Surgical History:  Procedure Laterality Date  . APPENDECTOMY     1950's  . HERNIA REPAIR     Inguinal  . TOTAL HIP ARTHROPLASTY Right      reports that he quit smoking about 16 years ago. His smoking use included cigarettes. He started smoking about 71 years ago. He has never used smokeless tobacco. No history on file for alcohol and drug.  No Known Allergies  Family History  Problem Relation Age of Onset  . Heart attack Father   . Cancer Brother      Prior to Admission medications   Medication Sig Start Date End Date Taking? Authorizing Provider  acetaminophen (TYLENOL) 325 MG tablet Take 650 mg by mouth every 6 (six) hours as needed.    Yes [provider]  calcitRIOL (ROCALTROL) 0.5 MCG capsule Take 1 capsule (0.5 mcg total) by mouth daily. 08/28/18  Yes Gerlene Fee, NP  febuxostat (ULORIC) 40 MG tablet Take 0.5 tablets (20 mg total) by mouth daily. Give 1/2 tablet ( 20 mg ) by mouth daily for Gout 08/28/18  Yes Gerlene Fee, NP  sodium bicarbonate 650 MG tablet Take 2 tablets (1,300 mg total) by mouth 3 (three) times daily. 08/28/18  Yes Gerlene Fee, NP  tamsulosin (FLOMAX) 0.4 MG CAPS capsule Take 1 capsule (0.4 mg total) by mouth daily. 08/28/18  Yes Gerlene Fee, NP    Physical Exam: Vitals:   01/08/19 1130 01/08/19 1145 01/08/19 1220 01/08/19 1400  BP: (!) 107/56 (!) 103/58  120/61  Pulse: 96 93    Resp:  16  (!) 21  Temp:   98.7 F (37.1 C)   TempSrc:   Rectal   SpO2: 97% (!) 88%        Constitutional:  Elderly frail thinly built African-American male sitting up in bed, alert awake oriented to self and place, partly to time Vitals:   01/08/19 1130 01/08/19 1145 01/08/19 1220 01/08/19 1400  BP: (!) 107/56 (!) 103/58  120/61  Pulse: 96 93    Resp:  16  (!) 21  Temp:   98.7 F (37.1 C)   TempSrc:   Rectal   SpO2: 97% (!) 88%     Eyes: PERRL, lids and conjunctivae normal ENMT: Oral mucosa is moist Neck: normal, supple Respiratory: Clear bilaterally Cardiovascular: S1-S2/regular rate rhythm Abdomen: soft, non tender, Bowel sounds positive.  Musculoskeletal: Chronic musculoskeletal deformity in his right hip limiting full range of motion Ext: No edema Skin: Dry scaly skin on upper and lower extremities  neurologic: Moves all extremities, no localizing signs Psychiatric: Appropriate mood and affect  Labs on Admission: I have personally reviewed following labs and imaging studies  CBC: Recent Labs  Lab 01/08/19 1258  WBC 5.4  NEUTROABS 4.1  HGB 11.1*  HCT 36.9*  MCV 97.1  PLT 453   Basic Metabolic Panel: Recent Labs  Lab 01/08/19 1258  NA 138  K 5.1  CL 108  CO2 20*  GLUCOSE 104*  BUN 46*  CREATININE 3.49*  CALCIUM 10.1  MG 2.0   GFR: CrCl cannot be calculated (Unknown ideal weight.). Liver Function Tests: Recent Labs  Lab 01/08/19 1258  AST 20  ALT 10  ALKPHOS 46  BILITOT 0.7  PROT 8.1  ALBUMIN 3.6   No results for input(s): LIPASE, AMYLASE in the last 168 hours. No results for input(s): AMMONIA in the last 168 hours. Coagulation Profile: No results for input(s): INR, PROTIME in the last 168 hours. Cardiac Enzymes: No results for input(s): CKTOTAL, CKMB, CKMBINDEX, TROPONINI in the last 168 hours. BNP (last 3 results) No results for input(s): PROBNP in the last 8760 hours. HbA1C: No results for input(s): HGBA1C in the last 72 hours. CBG: Recent Labs  Lab 01/08/19 1247  GLUCAP 101*   Lipid Profile: No results for input(s): CHOL, HDL, LDLCALC,  TRIG, CHOLHDL, LDLDIRECT in the last 72 hours. Thyroid Function Tests: No results for input(s): TSH, T4TOTAL, FREET4, T3FREE, THYROIDAB in the last 72 hours. Anemia Panel: No results for input(s): VITAMINB12, FOLATE, FERRITIN, TIBC, IRON, RETICCTPCT in the last 72 hours. Urine analysis: No results found for: COLORURINE, APPEARANCEUR, LABSPEC, PHURINE, GLUCOSEU, HGBUR, BILIRUBINUR, KETONESUR, PROTEINUR, UROBILINOGEN, NITRITE, LEUKOCYTESUR Sepsis Labs: @LABRCNTIP (procalcitonin:4,lacticidven:4) )No results found for this or any previous visit (from the past 240 hour(s)).   Radiological Exams on Admission: Dg Chest 2 View  Result Date: 01/08/2019 CLINICAL DATA:  Syncope versus seizure EXAM: CHEST - 2 VIEW COMPARISON:  November 29, 2018 FINDINGS: The lungs are clear without focal consolidation, edema, effusion or pneumothorax. Cardiomediastinal silhouette is within normal limits for size. No acute osseous findings. Again noted is bilateral shoulder arthropathy. Degenerative changes seen in the midthoracic spine. No acute osseous findings. IMPRESSION: No acute  cardiopulmonary process. Electronically Signed   By: Prudencio Pair M.D.   On: 01/08/2019 13:09   Ct Head Wo Contrast  Result Date: 01/08/2019 CLINICAL DATA:  Possible seizure versus syncope EXAM: CT HEAD WITHOUT CONTRAST TECHNIQUE: Contiguous axial images were obtained from the base of the skull through the vertex without intravenous contrast. COMPARISON:  10/09/2018 FINDINGS: Brain: Moderate atrophy unchanged. Extensive white matter disease bilaterally is stable from the prior study. Chronic lacunar infarction right thalamus is unchanged. Negative for acute infarct, hemorrhage, mass. Vascular: Negative for hyperdense vessel. Atherosclerotic calcification in the cavernous carotid bilaterally. Skull: Negative Sinuses/Orbits: Extensive chronic thickening of the left maxillary sinus and left sphenoid sinus due to chronic inflammation. Prior sinus surgery on  the left. No orbital lesion. Other: None IMPRESSION: Moderate atrophy and moderate to extensive chronic ischemic change. No acute abnormality and no change from the prior CT. Electronically Signed   By: Franchot Gallo M.D.   On: 01/08/2019 14:49   Ct Cervical Spine Wo Contrast  Result Date: 01/08/2019 CLINICAL DATA:  Seizure versus syncope. EXAM: CT CERVICAL SPINE WITHOUT CONTRAST TECHNIQUE: Multidetector CT imaging of the cervical spine was performed without intravenous contrast. Multiplanar CT image reconstructions were also generated. COMPARISON:  None. FINDINGS: Alignment: Straightening of the cervical lordosis. Skull base and vertebrae: No acute fracture. No primary bone lesion or focal pathologic process. Soft tissues and spinal canal: No prevertebral fluid or swelling. No visible canal hematoma. Disc levels: Multilevel osteoarthritic changes with disc space narrowing remodeling of vertebral bodies and osteophytosis. Upper chest: Negative. Other: Carotid artery calcifications. IMPRESSION: 1. No evidence of acute traumatic injury to the cervical spine. 2. Multilevel osteoarthritic changes of the cervical spine. Electronically Signed   By: Fidela Salisbury M.D.   On: 01/08/2019 14:53    EKG: Independently reviewed.  Sinus rhythm, no acute ST-T wave changes noted  Assessment/Plan  Syncope -Suspect orthostatic, patient recalls feeling faint upon waking up this morning and walking to the bathroom, as any chest pain, high-sensitivity troponin is 20, EKG without any acute ST-T wave changes -We will check orthostatic vital signs -Hold Flomax, given alpha blockade -Monitor on telemetry, check 2D echocardiogram -I will hold off on EEG at this time, symptoms do not appear to be secondary to seizures  Chronic kidney disease, stage V (HCC) -Stable, followed by nephrology at [facility] -Renal diet -Resume calcitriol and sodium bicarbonate -Clinically appears euvolemic, monitor I's/O  Anemia of  chronic disease -Stable, monitor  Neurogenic bladder with chronic retention -Requires I&O catheterization at baseline -Place Foley catheter while inpatient, remove at discharge -Hold Flomax, since this can exacerbate orthostatic hypotension   DVT prophylaxis: Heparin subcutaneous Code Status: DNR per patient's wishes, was DNR at [facility] last week as well Family Communication: No family at bedside Disposition Plan: Inpatient Consults called: None Admission status: Observation  Domenic Polite MD [facility]   01/08/2019, 4:15 PM

## 2019-01-08 NOTE — ED Provider Notes (Signed)
[facility] EMERGENCY DEPARTMENT Provider Note   CSN: [id number] Arrival date & time: 01/08/19  1115    History   Chief Complaint No chief complaint on file.   HPI Ray Howard is a 85 y.o. male with history of CKD stage V not on dialysis, HTN, neurogenic bladder, COPD, GERD who had a recent hospital admission for AKI and hyperkalemia brought to the ER by EMS for evaluation of being found unresponsive.  Per EMS report patient called out because patient was not responding.  EMS found patient unresponsive to sternal rub, agonal breathing 6 times per minute, pupils fixed and pinpoint with tachycardia around 120 with weak and thready pulses.  This lasted for approximately 10 minutes.  When patient was placed on the stretcher and wheeled out he became fully oriented.  There was loss of bladder or bowel control.  Patient is fully oriented to self, place, year states that he does not know why he is here but he was told that he passed out.  Currently feels fine and has no complaints.  Admits that he started feeling poorly last night.  Has a difficult time describing this but just states that he did not feel very well.  He had a little bit of lightheadedness as well.  He denies any pain.  He denies any headache, vision changes, chest pain, shortness of breath, abdominal pain.  No recent vomiting or diarrhea.     HPI  Past Medical History:  Diagnosis Date  . ARF (acute renal failure) ([place])   . Bilateral hydronephrosis   . Chronic obstructive pulmonary disease (COPD) ([place])   . Essential hypertension   . GERD (gastroesophageal reflux disease)   . MRSA (methicillin resistant staph aureus) culture positive   . Osteoarthritis of multiple joints   . Transfusion history    with hip surgery    Patient Active Problem List   Diagnosis Date Noted  . Syncope and collapse 01/08/2019  . Syncope 01/08/2019  . Acute gout due to renal impairment involving right hand 08/28/2018  . Melena  08/11/2018  . Gall stones, common bile duct 08/11/2018  . Left knee pain 08/11/2018  . Urinary tract infection 08/11/2018  . Neurogenic bladder 08/11/2018  . Hyperkalemia 04/07/2018  . Chronic obstructive pulmonary disease (COPD) ([place]) 04/07/2018  . Facial muscle weakness 03/17/2018  . Paresthesia of left arm 03/17/2018  . Brachial plexus neuralgia 03/17/2018  . Chronic kidney disease, stage V ([place]) 03/17/2018  . Essential hypertension 03/17/2018  . Bilateral hydronephrosis 03/17/2018  . Dysphasia 03/17/2018  . GERD (gastroesophageal reflux disease) 03/17/2018  . Hypercalcemia 03/17/2018  . Metabolic acidosis [id number]/[id number]    Past Surgical History:  Procedure Laterality Date  . APPENDECTOMY     1950's  . HERNIA REPAIR     Inguinal  . TOTAL HIP ARTHROPLASTY Right         Home Medications    Prior to Admission medications   Medication Sig Start Date End Date Taking? Authorizing Provider  acetaminophen (TYLENOL) 325 MG tablet Take 650 mg by mouth every 6 (six) hours as needed.    Yes [provider]  calcitRIOL (ROCALTROL) 0.5 MCG capsule Take 1 capsule (0.5 mcg total) by mouth daily. 08/28/18  Yes Gerlene Fee, NP  febuxostat (ULORIC) 40 MG tablet Take 0.5 tablets (20 mg total) by mouth daily. Give 1/2 tablet ( 20 mg ) by mouth daily for Gout 08/28/18  Yes Gerlene Fee, NP  sodium bicarbonate 650 MG tablet Take  2 tablets (1,300 mg total) by mouth 3 (three) times daily. 08/28/18  Yes Gerlene Fee, NP  tamsulosin (FLOMAX) 0.4 MG CAPS capsule Take 1 capsule (0.4 mg total) by mouth daily. 08/28/18  Yes Gerlene Fee, NP    Family History Family History  Problem Relation Age of Onset  . Heart attack Father   . Cancer Brother     Social History Social History   Tobacco Use  . Smoking status: Former Smoker    Types: Cigarettes    Start date: 1949    Quit date: 2004    Years since quitting: 16.6  . Smokeless tobacco: Never Used  Substance Use Topics   . Alcohol use: Not on file  . Drug use: Not on file     Allergies   Patient has no known allergies.   Review of Systems Review of Systems  Constitutional:       Malaise   Neurological: Positive for syncope.  All other systems reviewed and are negative.    Physical Exam Updated Vital Signs BP 120/61   Pulse 93   Temp 98.7 F (37.1 C) (Rectal)   Resp (!) 21   SpO2 (!) 88%   Physical Exam Vitals signs and nursing note reviewed.  Constitutional:      Appearance: He is well-developed.     Comments: Non toxic.  HENT:     Head: Normocephalic and atraumatic.     Comments: No scalp wound, facial bone tenderness.  No obvious signs of head or facial trauma.    Nose: Nose normal.     Mouth/Throat:     Comments: Poor dentition.  Moist mucous membranes.  No intraoral or tongue injury or bleeding. Eyes:     Conjunctiva/sclera: Conjunctivae normal.  Neck:     Musculoskeletal: Normal range of motion.     Comments: C-spine: No cervical collar in place.  No tenderness to paraspinal muscles, midline spine.  Full range of motion of the neck without pain. Cardiovascular:     Rate and Rhythm: Normal rate and regular rhythm.     Heart sounds: Normal heart sounds.     Comments: 1+ radial and DP pulses bilaterally.  Trace edema to lower legs symmetric Pulmonary:     Effort: Pulmonary effort is normal.     Breath sounds: Normal breath sounds.  Abdominal:     General: Bowel sounds are normal.     Palpations: Abdomen is soft.     Tenderness: There is no abdominal tenderness.     Comments: No G/R/R. No suprapubic or CVA tenderness. Negative Murphy's and McBurney's  Musculoskeletal: Normal range of motion.     Comments: TL spine: No midline or paraspinal muscle tenderness.  No bruising or signs of trauma to the back. Pelvis: No instability with AP/L instability with compression.  No bony tenderness to prominences of the hips bilaterally.  Patient cannot flex at the right hip states that he  has had a replacement.  Skin:    General: Skin is warm and dry.     Capillary Refill: Capillary refill takes less than 2 seconds.  Neurological:     Mental Status: He is alert.     Comments:  Alert and oriented to self, place, time and event.  Speech is fluent without dysarthria or dysphasia. Strength 5/5 with hand grip and ankle F/E.   Sensation to light touch intact in face, hands and feet. Unable to assess gait. Cannot sit up without assistance No pronator drift. Unable  to assess R leg drop. Normal L lift and hold. CN I not tested CN II not tested CN III, IV, VI PEERL and EOMs intact bilaterally CN V light touch intact in all 3 divisions of trigeminal nerve CN VII facial movements symmetric CN VIII not tested CN IX, X no uvula deviation, symmetric rise of soft palate  CN XI 5/5 SCM and trapezius strength bilaterally  CN XII Midline tongue protrusion, symmetric L/R movements  Psychiatric:        Behavior: Behavior normal.      ED Treatments / Results  Labs (all labs ordered are listed, but only abnormal results are displayed) Labs Reviewed  CBC WITH DIFFERENTIAL/PLATELET - Abnormal; Notable for the following components:      Result Value   RBC 3.80 (*)    Hemoglobin 11.1 (*)    HCT 36.9 (*)    All other components within normal limits  COMPREHENSIVE METABOLIC PANEL - Abnormal; Notable for the following components:   CO2 20 (*)    Glucose, Bld 104 (*)    BUN 46 (*)    Creatinine, Ser 3.49 (*)    GFR calc non Af Amer 15 (*)    GFR calc Af Amer 17 (*)    All other components within normal limits  CBG MONITORING, ED - Abnormal; Notable for the following components:   Glucose-Capillary 101 (*)    All other components within normal limits  TROPONIN I (HIGH SENSITIVITY) - Abnormal; Notable for the following components:   Troponin I (High Sensitivity) 20 (*)    All other components within normal limits  MAGNESIUM  RAPID URINE DRUG SCREEN, HOSP PERFORMED  URINALYSIS,  ROUTINE W REFLEX MICROSCOPIC  CBG MONITORING, ED  TROPONIN I (HIGH SENSITIVITY)    EKG EKG Interpretation  Date/Time:  Wednesday January 08 2019 13:42:14 EDT Ventricular Rate:  82 PR Interval:    QRS Duration: 84 QT Interval:  380 QTC Calculation: 444 R Axis:   83 Text Interpretation:  Sinus rhythm Borderline right axis deviation Borderline low voltage, extremity leads When compared to prior, no significant changes seen.  No STEMI Confirmed by Antony Blackbird [phone]) on 01/08/2019 2:14:00 PM   Radiology Dg Chest 2 View  Result Date: 01/08/2019 CLINICAL DATA:  Syncope versus seizure EXAM: CHEST - 2 VIEW COMPARISON:  November 29, 2018 FINDINGS: The lungs are clear without focal consolidation, edema, effusion or pneumothorax. Cardiomediastinal silhouette is within normal limits for size. No acute osseous findings. Again noted is bilateral shoulder arthropathy. Degenerative changes seen in the midthoracic spine. No acute osseous findings. IMPRESSION: No acute cardiopulmonary process. Electronically Signed   By: Prudencio Pair M.D.   On: 01/08/2019 13:09   Ct Head Wo Contrast  Result Date: 01/08/2019 CLINICAL DATA:  Possible seizure versus syncope EXAM: CT HEAD WITHOUT CONTRAST TECHNIQUE: Contiguous axial images were obtained from the base of the skull through the vertex without intravenous contrast. COMPARISON:  10/09/2018 FINDINGS: Brain: Moderate atrophy unchanged. Extensive white matter disease bilaterally is stable from the prior study. Chronic lacunar infarction right thalamus is unchanged. Negative for acute infarct, hemorrhage, mass. Vascular: Negative for hyperdense vessel. Atherosclerotic calcification in the cavernous carotid bilaterally. Skull: Negative Sinuses/Orbits: Extensive chronic thickening of the left maxillary sinus and left sphenoid sinus due to chronic inflammation. Prior sinus surgery on the left. No orbital lesion. Other: None IMPRESSION: Moderate atrophy and moderate to extensive  chronic ischemic change. No acute abnormality and no change from the prior CT. Electronically Signed   By:  Franchot Gallo M.D.   On: 01/08/2019 14:49   Ct Cervical Spine Wo Contrast  Result Date: 01/08/2019 CLINICAL DATA:  Seizure versus syncope. EXAM: CT CERVICAL SPINE WITHOUT CONTRAST TECHNIQUE: Multidetector CT imaging of the cervical spine was performed without intravenous contrast. Multiplanar CT image reconstructions were also generated. COMPARISON:  None. FINDINGS: Alignment: Straightening of the cervical lordosis. Skull base and vertebrae: No acute fracture. No primary bone lesion or focal pathologic process. Soft tissues and spinal canal: No prevertebral fluid or swelling. No visible canal hematoma. Disc levels: Multilevel osteoarthritic changes with disc space narrowing remodeling of vertebral bodies and osteophytosis. Upper chest: Negative. Other: Carotid artery calcifications. IMPRESSION: 1. No evidence of acute traumatic injury to the cervical spine. 2. Multilevel osteoarthritic changes of the cervical spine. Electronically Signed   By: Fidela Salisbury M.D.   On: 01/08/2019 14:53    Procedures Procedures (including critical care time)  Medications Ordered in ED Medications - No data to display   Initial Impression / Assessment and Plan / ED Course  I have reviewed the triage vital signs and the nursing notes.  Pertinent labs & imaging results that were available during my care of the patient were reviewed by me and considered in my medical decision making (see chart for details).  EMS report very concerning. Given h/o CKD not on dialysis, tachycardia concern for dysrrhythmia, electrolyte abnormalities causing syncope.   BP is 107/56 on arrival.  Will obtain labs, EKG, CXR, CT head/c-spine. On cardiac monitor.   1548: ER work-up reviewed by me.  Creatinine is at his baseline.  Hemoglobin at baseline.  Head CT unremarkable.  EKG without ischemia.  No dysrhythmias there is been  no clinical decline on reevaluation.  Given patient's age, past medical history is considered high risk for cardiac syncope versus other.  This does not sound like grand mal seizure. Discussed with medicine team who will admit. Final Clinical Impressions(s) / ED Diagnoses   Final diagnoses:  Loss of consciousness [facility])    ED Discharge Orders    None       Arlean Hopping 01/08/19 1634    Tegeler, Gwenyth Allegra, MD 01/09/19 2121

## 2019-01-08 NOTE — ED Notes (Signed)
Attempted to call report

## 2019-01-08 NOTE — ED Triage Notes (Signed)
Per [facility]: Pts family called out due to being unresponsive. Pt told family that he didn't feel well, walked to restroom, was in there for a while. famiyl went in and pt was unresponsive on the floor. Pt was incontinent to bowel and bladder. EMS found that the pt had tachycardia around 120 with weak and thready pulses. Pt was breathing about 6 times a minute and was agonal. Pupils where fixed and pinpoint. Pt was unresponsive to all stimuli for about 10 minutes. When EMS moved pt over to their stretcher the pt became alert and slowly became oriented. EMS states that pt was recently seen and discharged from [facility] with potassium imbalance. Left arm restricted.

## 2019-01-08 NOTE — Progress Notes (Signed)
Patient go up to floor.  Per ED nurse patient wanted to try to pee prior to placing foley.   Once to floor - patient still wanted to try, but was ok with foley placement.     Let the night time nurse know there was an order for foley.     Also patient stated he wanted life saving measures taken if needed.   Paged MD to let them know

## 2019-01-08 NOTE — ED Notes (Signed)
Pt is trying to use urinal when told that a catheter insertion has been ordered.

## 2019-01-09 DIAGNOSIS — I10 Essential (primary) hypertension: Secondary | ICD-10-CM | POA: Diagnosis not present

## 2019-01-09 DIAGNOSIS — J449 Chronic obstructive pulmonary disease, unspecified: Secondary | ICD-10-CM | POA: Diagnosis not present

## 2019-01-09 DIAGNOSIS — N185 Chronic kidney disease, stage 5: Secondary | ICD-10-CM | POA: Diagnosis not present

## 2019-01-09 LAB — CBC
HCT: 30 % — ABNORMAL LOW (ref 39.0–52.0)
Hemoglobin: 9.3 g/dL — ABNORMAL LOW (ref 13.0–17.0)
MCH: 29.2 pg (ref 26.0–34.0)
MCHC: 31 g/dL (ref 30.0–36.0)
MCV: 94.3 fL (ref 80.0–100.0)
Platelets: 175 10*3/uL (ref 150–400)
RBC: 3.18 MIL/uL — ABNORMAL LOW (ref 4.22–5.81)
RDW: 13.7 % (ref 11.5–15.5)
WBC: 4.5 10*3/uL (ref 4.0–10.5)
nRBC: 0 % (ref 0.0–0.2)

## 2019-01-09 LAB — COMPREHENSIVE METABOLIC PANEL
ALT: 10 U/L (ref 0–44)
AST: 17 U/L (ref 15–41)
Albumin: 2.9 g/dL — ABNORMAL LOW (ref 3.5–5.0)
Alkaline Phosphatase: 39 U/L (ref 38–126)
Anion gap: 11 (ref 5–15)
BUN: 51 mg/dL — ABNORMAL HIGH (ref 8–23)
CO2: 19 mmol/L — ABNORMAL LOW (ref 22–32)
Calcium: 9.2 mg/dL (ref 8.9–10.3)
Chloride: 107 mmol/L (ref 98–111)
Creatinine, Ser: 3.38 mg/dL — ABNORMAL HIGH (ref 0.61–1.24)
GFR calc Af Amer: 18 mL/min — ABNORMAL LOW (ref >60)
GFR calc non Af Amer: 15 mL/min — ABNORMAL LOW (ref >60)
Glucose, Bld: 77 mg/dL (ref 70–99)
Potassium: 4.9 mmol/L (ref 3.5–5.1)
Sodium: 137 mmol/L (ref 135–145)
Total Bilirubin: 0.6 mg/dL (ref 0.3–1.2)
Total Protein: 6.4 g/dL — ABNORMAL LOW (ref 6.5–8.1)

## 2019-01-09 LAB — URINALYSIS, ROUTINE W REFLEX MICROSCOPIC
Bilirubin Urine: NEGATIVE
Glucose, UA: NEGATIVE mg/dL
Ketones, ur: NEGATIVE mg/dL
Nitrite: NEGATIVE
Protein, ur: NEGATIVE mg/dL
Specific Gravity, Urine: 1.01 (ref 1.005–1.030)
WBC, UA: >50 WBC/hpf — ABNORMAL HIGH (ref 0–5)
pH: 7 (ref 5.0–8.0)

## 2019-01-09 LAB — RAPID URINE DRUG SCREEN, HOSP PERFORMED
Amphetamines: NOT DETECTED
Barbiturates: NOT DETECTED
Benzodiazepines: NOT DETECTED
Cocaine: NOT DETECTED
Opiates: NOT DETECTED
Tetrahydrocannabinol: NOT DETECTED

## 2019-01-09 LAB — SARS CORONAVIRUS 2 (TAT 6-24 HRS): SARS Coronavirus 2: NEGATIVE

## 2019-01-09 MED ORDER — SODIUM CHLORIDE 0.9 % IV SOLN
INTRAVENOUS | Status: DC
  Administered 2019-01-09: 14:00:00 via INTRAVENOUS

## 2019-01-09 NOTE — Care Management Obs Status (Signed)
[place] [facility] NOTIFICATION   Patient Details  Name: Ray Howard MRN: [id number] Date of Birth: 1927/12/19   Medicare Observation Status Notification Given:       Marilu Favre, RN 01/09/2019, 2:23 PM

## 2019-01-09 NOTE — TOC Initial Note (Signed)
Transition of Care [facility]) - Initial/Assessment Note    Patient Details  Name: Phyllip Itzkowitz MRN: [id number] Date of Birth: [birth year]  Transition of Care [facility]) CM/SW Contact:    Marilu Favre, RN Phone Number: 01/09/2019, 2:26 PM  Clinical Narrative:                 Patient from home active with [facility] and [place] . Patient currently observation status , no need for home health orders, however if patient status changes to inpatient , will need resumption of care orders.  Expected Discharge Plan: [place] Barriers to Discharge: Continued Medical Work up   Patient Goals and CMS Choice Patient states their goals for this hospitalization and ongoing recovery are:: to go home CMS Medicare.gov Compare Post Acute Care list provided to:: Patient    Expected Discharge Plan and Services Expected Discharge Plan: [place]       Living arrangements for the past 2 months: Single Family Home                 DME Arranged: N/A                    Prior Living Arrangements/Services Living arrangements for the past 2 months: Single Family Home Lives with:: Spouse Patient language and need for interpreter reviewed:: Yes Do you feel safe going back to the place where you live?: Yes      Need for Family Participation in Patient Care: Yes (Comment) Care giver support system in place?: Yes (comment) Current home services: Home PT, Home RN(Active with [facility] for RN and PT) Criminal Activity/Legal Involvement Pertinent to Current Situation/Hospitalization: No - Comment as needed  Activities of Daily Living      Permission Sought/Granted   Permission granted to share information with : Yes, Verbal Permission Granted     Permission granted to share info w AGENCY: [facility]        Emotional Assessment Appearance:: Appears stated age Attitude/Demeanor/Rapport: Engaged Affect (typically observed):  Accepting Orientation: : Oriented to Self, Oriented to Place, Oriented to  Time, Oriented to Situation Alcohol / Substance Use: Not Applicable Psych Involvement: No (comment)  Admission diagnosis:  Loss of consciousness ([place]) [R40.20] Patient Active Problem List   Diagnosis Date Noted  . Syncope and collapse 01/08/2019  . Syncope 01/08/2019  . Acute gout due to renal impairment involving right hand 08/28/2018  . Melena 08/11/2018  . Gall stones, common bile duct 08/11/2018  . Left knee pain 08/11/2018  . Urinary tract infection 08/11/2018  . Neurogenic bladder 08/11/2018  . Hyperkalemia 04/07/2018  . Chronic obstructive pulmonary disease (COPD) ([place]) 04/07/2018  . Facial muscle weakness 03/17/2018  . Paresthesia of left arm 03/17/2018  . Brachial plexus neuralgia 03/17/2018  . Chronic kidney disease, stage V ([place]) 03/17/2018  . Essential hypertension 03/17/2018  . Bilateral hydronephrosis 03/17/2018  . Dysphasia 03/17/2018  . GERD (gastroesophageal reflux disease) 03/17/2018  . Hypercalcemia 03/17/2018  . Metabolic acidosis [id number]   PCP:  Hennie Duos, MD Pharmacy:   [facility] [place], [place] [address] [place] [address] Phone: [phone] Fax: [place], [address] [place] AT [address] [place] [address] Phone: [phone] Fax: [phone]     Social  Determinants of Health (SDOH) Interventions    Readmission Risk Interventions No flowsheet data found.

## 2019-01-09 NOTE — Plan of Care (Signed)
  Problem: Education: Goal: Knowledge of General Education information will improve Description: Including pain rating scale, medication(s)/side effects and non-pharmacologic comfort measures 01/09/2019 0327 by Nelia Shi, RN Outcome: Progressing 01/09/2019 0326 by Nelia Shi, RN Outcome: Progressing

## 2019-01-09 NOTE — Progress Notes (Addendum)
PROGRESS NOTE    Seager Sandberg  [id number] DOB: 11-25-1927 DOA: 01/08/2019 PCP: Hennie Duos, MD    Brief Narrative:  HPI per Dr. Serita Sheller is a 85 y.o. male with medical history significant for stage 5 chronic kidney disease, not on dialysis yet followed by nephrology yet [place], COPD, anemia of chronic disease, neurogenic bladder with urinary retention requiring self-catheterization, was just discharged from [facility] regional hospital yesterday after admission for hypotension, acute kidney injury and hyperkalemia.  Patient's baseline creatinine is around 3, he was hospitalized a week ago with a creatinine of 5.3, potassium of 6 and hypotension, he was treated with IV fluids, sodium bicarbonate, followed by nephrology, renal ultrasound did not show hydronephrosis, he was also found to have Pseudomonas in his urine culture which was felt to be a contaminant by infectious disease, ultimately his creatinine improved to 3.0 and he was discharged home yesterday early afternoon. -Patient reports feeling okay at discharge he lives with his wife, he went to bed last night, woke up this morning and felt faint, walked to the bathroom and remembers sitting down on his commode, does not recall the events after this, subsequently found down on the bathroom floor by family unresponsive, EMS was called patient was found to be tachycardic with a thready pulse and decreased respiration, subsequently regained his consciousness and was brought to ED. there was reported incontinence of stool on the bathroom floor. -Patient denies any complaints at this time, he denies any fevers or chills, no dyspnea chest pain or shortness of breath no palpitations no nausea vomiting or diarrhea  ED Course: Blood pressure was 103/58 on arrival, rest of vital signs were stable, labs noted stage IV-V kidney disease otherwise unremarkable labs   Assessment & Plan:   Principal Problem:   Syncope and collapse  Active Problems:   Chronic kidney disease, stage V (HCC)   Essential hypertension   Chronic obstructive pulmonary disease (COPD) (HCC)   Syncope  1 syncope Patient presented with a syncopal episode and noted on arrival to have a blood pressure of 103/58.  Patient with a history of stage IV stage V chronic kidney disease.  Patient does endorse feeling lightheaded and dizzy prior to syncopal episode.  Patient also stating that from supine to sitting position has had some bouts of dizziness.  Patient denies any tongue biting no jerking motions of extremities prior to admission.  Patient does note he had some bowel incontinence.  Orthostatics were ordered on admission however not done.  2D echo pending.  Patient with no signs or symptoms of infection.  Urinalysis pending.  Patient with no overt GI bleed.  Patient with no focal neurological deficits.  Head CT which was done was unremarkable.  Check orthostatics.  Continue to hold Flomax and likely will not resume due to orthostatic hypotension.  2D echo pending.  Continue hydration with IV fluids.  Follow.  2.  Chronic kidney disease stage V Stable.  Continue calcitriol and bicarb tablets.  Currently euvolemic.  Follow.  3.  COPD Stable.  4.  Neurogenic bladder with chronic retention Patient requiring INO catheterizations intermittently at baseline.  Foley catheter ordered and likely continue during the hospitalization and discontinue on discharge.  We will hold Flomax as it is felt this could exacerbate orthostatic hypotension.  5.  Anemia Likely dilutional.  Patient denies any overt bleeding.  Check an anemia panel.  Follow H&H.  Transfusion threshold hemoglobin less than 7.   DVT prophylaxis: Heparin Code  Status: Full Family Communication: Updated patient.  No family at bedside. Disposition Plan: To be determined.   Consultants:   None  Procedures:   2D echo pending  CT head CT C-spine 01/08/2019  Chest x-ray 01/08/2019   Antimicrobials:  None   Subjective: Patient laying in bed.  Denies any chest pain.  No shortness of breath.  No abdominal pain.  States he gets dizzy from laying to sitting and standing.  Patient denies any overt bleeding.   Objective: Vitals:   01/09/19 1026 01/09/19 1029 01/09/19 1032 01/09/19 1232  BP: (!) 165/83 (!) 163/74 132/83 (!) 146/66  Pulse: 74 82 97 72  Resp:    19  Temp:    97.9 F (36.6 C)  TempSrc:    Oral  SpO2: 100%   100%  Weight:        Intake/Output Summary (Last 24 hours) at 01/09/2019 1701 Last data filed at 01/09/2019 1313 Gross per 24 hour  Intake 1200 ml  Output 800 ml  Net 400 ml   Filed Weights   01/09/19 0511  Weight: 71 kg    Examination:  General exam: Appears calm and comfortable  Respiratory system: Clear to auscultation anterior lung fields. Respiratory effort normal. Cardiovascular system: S1 & S2 heard, RRR. No JVD, murmurs, rubs, gallops or clicks. No pedal edema. Gastrointestinal system: Abdomen is nondistended, soft and nontender. No organomegaly or masses felt. Normal bowel sounds heard. Central nervous system: Alert and oriented.  Right lower extremity weakness chronic per patient.  Extremities: Symmetric 5 x 5 power. Skin: No rashes, lesions or ulcers Psychiatry: Judgement and insight appear normal. Mood & affect appropriate.     Data Reviewed: I have personally reviewed following labs and imaging studies  CBC: Recent Labs  Lab 01/08/19 1258 01/09/19 0438  WBC 5.4 4.5  NEUTROABS 4.1  --   HGB 11.1* 9.3*  HCT 36.9* 30.0*  MCV 97.1 94.3  PLT 188 0000000   Basic Metabolic Panel: Recent Labs  Lab 01/08/19 1258 01/09/19 0438  NA 138 137  K 5.1 4.9  CL 108 107  CO2 20* 19*  GLUCOSE 104* 77  BUN 46* 51*  CREATININE 3.49* 3.38*  CALCIUM 10.1 9.2  MG 2.0  --    GFR: Estimated Creatinine Clearance: 15.2 mL/min (A) (by C-G formula based on SCr of 3.38 mg/dL (H)). Liver Function Tests: Recent Labs  Lab 01/08/19 1258  01/09/19 0438  AST 20 17  ALT 10 10  ALKPHOS 46 39  BILITOT 0.7 0.6  PROT 8.1 6.4*  ALBUMIN 3.6 2.9*   No results for input(s): LIPASE, AMYLASE in the last 168 hours. No results for input(s): AMMONIA in the last 168 hours. Coagulation Profile: No results for input(s): INR, PROTIME in the last 168 hours. Cardiac Enzymes: No results for input(s): CKTOTAL, CKMB, CKMBINDEX, TROPONINI in the last 168 hours. BNP (last 3 results) No results for input(s): PROBNP in the last 8760 hours. HbA1C: No results for input(s): HGBA1C in the last 72 hours. CBG: Recent Labs  Lab 01/08/19 1247  GLUCAP 101*   Lipid Profile: No results for input(s): CHOL, HDL, LDLCALC, TRIG, CHOLHDL, LDLDIRECT in the last 72 hours. Thyroid Function Tests: No results for input(s): TSH, T4TOTAL, FREET4, T3FREE, THYROIDAB in the last 72 hours. Anemia Panel: No results for input(s): VITAMINB12, FOLATE, FERRITIN, TIBC, IRON, RETICCTPCT in the last 72 hours. Sepsis Labs: No results for input(s): PROCALCITON, LATICACIDVEN in the last 168 hours.  No results found for this or any  previous visit (from the past 240 hour(s)).       Radiology Studies: Dg Chest 2 View  Result Date: 01/08/2019 CLINICAL DATA:  Syncope versus seizure EXAM: CHEST - 2 VIEW COMPARISON:  November 29, 2018 FINDINGS: The lungs are clear without focal consolidation, edema, effusion or pneumothorax. Cardiomediastinal silhouette is within normal limits for size. No acute osseous findings. Again noted is bilateral shoulder arthropathy. Degenerative changes seen in the midthoracic spine. No acute osseous findings. IMPRESSION: No acute cardiopulmonary process. Electronically Signed   By: Prudencio Pair M.D.   On: 01/08/2019 13:09   Ct Head Wo Contrast  Result Date: 01/08/2019 CLINICAL DATA:  Possible seizure versus syncope EXAM: CT HEAD WITHOUT CONTRAST TECHNIQUE: Contiguous axial images were obtained from the base of the skull through the vertex without  intravenous contrast. COMPARISON:  10/09/2018 FINDINGS: Brain: Moderate atrophy unchanged. Extensive white matter disease bilaterally is stable from the prior study. Chronic lacunar infarction right thalamus is unchanged. Negative for acute infarct, hemorrhage, mass. Vascular: Negative for hyperdense vessel. Atherosclerotic calcification in the cavernous carotid bilaterally. Skull: Negative Sinuses/Orbits: Extensive chronic thickening of the left maxillary sinus and left sphenoid sinus due to chronic inflammation. Prior sinus surgery on the left. No orbital lesion. Other: None IMPRESSION: Moderate atrophy and moderate to extensive chronic ischemic change. No acute abnormality and no change from the prior CT. Electronically Signed   By: Franchot Gallo M.D.   On: 01/08/2019 14:49   Ct Cervical Spine Wo Contrast  Result Date: 01/08/2019 CLINICAL DATA:  Seizure versus syncope. EXAM: CT CERVICAL SPINE WITHOUT CONTRAST TECHNIQUE: Multidetector CT imaging of the cervical spine was performed without intravenous contrast. Multiplanar CT image reconstructions were also generated. COMPARISON:  None. FINDINGS: Alignment: Straightening of the cervical lordosis. Skull base and vertebrae: No acute fracture. No primary bone lesion or focal pathologic process. Soft tissues and spinal canal: No prevertebral fluid or swelling. No visible canal hematoma. Disc levels: Multilevel osteoarthritic changes with disc space narrowing remodeling of vertebral bodies and osteophytosis. Upper chest: Negative. Other: Carotid artery calcifications. IMPRESSION: 1. No evidence of acute traumatic injury to the cervical spine. 2. Multilevel osteoarthritic changes of the cervical spine. Electronically Signed   By: Fidela Salisbury M.D.   On: 01/08/2019 14:53        Scheduled Meds: . calcitRIOL  0.5 mcg Oral Daily  . febuxostat  20 mg Oral Daily  . heparin  5,000 Units Subcutaneous Q8H  . sodium bicarbonate  1,300 mg Oral TID    Continuous Infusions: . sodium chloride 75 mL/hr at 01/09/19 1339     LOS: 0 days    Time spent: 35 minutes    Irine Seal, MD [facility]  If 7PM-7AM, please contact night-coverage [email or web address] 01/09/2019, 5:01 PM

## 2019-01-10 ENCOUNTER — Observation Stay (HOSPITAL_BASED_OUTPATIENT_CLINIC_OR_DEPARTMENT_OTHER): Payer: Medicare HMO

## 2019-01-10 ENCOUNTER — Observation Stay (HOSPITAL_COMMUNITY): Payer: Medicare HMO

## 2019-01-10 DIAGNOSIS — N185 Chronic kidney disease, stage 5: Secondary | ICD-10-CM | POA: Diagnosis not present

## 2019-01-10 DIAGNOSIS — N390 Urinary tract infection, site not specified: Secondary | ICD-10-CM | POA: Diagnosis not present

## 2019-01-10 DIAGNOSIS — J449 Chronic obstructive pulmonary disease, unspecified: Secondary | ICD-10-CM | POA: Diagnosis not present

## 2019-01-10 DIAGNOSIS — I10 Essential (primary) hypertension: Secondary | ICD-10-CM | POA: Diagnosis not present

## 2019-01-10 DIAGNOSIS — R55 Syncope and collapse: Secondary | ICD-10-CM | POA: Diagnosis not present

## 2019-01-10 LAB — RENAL FUNCTION PANEL
Albumin: 2.8 g/dL — ABNORMAL LOW (ref 3.5–5.0)
Anion gap: 10 (ref 5–15)
BUN: 47 mg/dL — ABNORMAL HIGH (ref 8–23)
CO2: 19 mmol/L — ABNORMAL LOW (ref 22–32)
Calcium: 9.3 mg/dL (ref 8.9–10.3)
Chloride: 108 mmol/L (ref 98–111)
Creatinine, Ser: 3.16 mg/dL — ABNORMAL HIGH (ref 0.61–1.24)
GFR calc Af Amer: 19 mL/min — ABNORMAL LOW (ref >60)
GFR calc non Af Amer: 17 mL/min — ABNORMAL LOW (ref >60)
Glucose, Bld: 97 mg/dL (ref 70–99)
Phosphorus: 2.8 mg/dL (ref 2.5–4.6)
Potassium: 4.5 mmol/L (ref 3.5–5.1)
Sodium: 137 mmol/L (ref 135–145)

## 2019-01-10 LAB — IRON AND TIBC
Iron: 47 ug/dL (ref 45–182)
Saturation Ratios: 25 % (ref 17.9–39.5)
TIBC: 185 ug/dL — ABNORMAL LOW (ref 250–450)
UIBC: 138 ug/dL

## 2019-01-10 LAB — ECHOCARDIOGRAM COMPLETE: Weight: 2433.88 oz

## 2019-01-10 LAB — FERRITIN: Ferritin: 494 ng/mL — ABNORMAL HIGH (ref 24–336)

## 2019-01-10 LAB — CBC
HCT: 28.9 % — ABNORMAL LOW (ref 39.0–52.0)
Hemoglobin: 9.1 g/dL — ABNORMAL LOW (ref 13.0–17.0)
MCH: 29.4 pg (ref 26.0–34.0)
MCHC: 31.5 g/dL (ref 30.0–36.0)
MCV: 93.5 fL (ref 80.0–100.0)
Platelets: 147 10*3/uL — ABNORMAL LOW (ref 150–400)
RBC: 3.09 MIL/uL — ABNORMAL LOW (ref 4.22–5.81)
RDW: 13.6 % (ref 11.5–15.5)
WBC: 4.4 10*3/uL (ref 4.0–10.5)
nRBC: 0 % (ref 0.0–0.2)

## 2019-01-10 LAB — VITAMIN B12: Vitamin B-12: 477 pg/mL (ref 180–914)

## 2019-01-10 LAB — FOLATE: Folate: 8.3 ng/mL (ref >5.9)

## 2019-01-10 MED ORDER — MECLIZINE HCL 25 MG PO TABS
12.5000 mg | ORAL_TABLET | Freq: Three times a day (TID) | ORAL | Status: DC | PRN
  Administered 2019-01-11: 12.5 mg via ORAL
  Filled 2019-01-10 (×2): qty 1

## 2019-01-10 NOTE — Progress Notes (Addendum)
PROGRESS NOTE    Ray Howard  [id number] DOB: July 14, 1927 DOA: 01/08/2019 PCP: Hennie Duos, MD    Brief Narrative:  HPI per Dr. Serita Sheller is a 85 y.o. male with medical history significant for stage 5 chronic kidney disease, not on dialysis yet followed by nephrology yet [place], COPD, anemia of chronic disease, neurogenic bladder with urinary retention requiring self-catheterization, was just discharged from [facility] yesterday after admission for hypotension, acute kidney injury and hyperkalemia.  Patient's baseline creatinine is around 3, he was hospitalized a week ago with a creatinine of 5.3, potassium of 6 and hypotension, he was treated with IV fluids, sodium bicarbonate, followed by nephrology, renal ultrasound did not show hydronephrosis, he was also found to have Pseudomonas in his urine culture which was felt to be a contaminant by infectious disease, ultimately his creatinine improved to 3.0 and he was discharged home yesterday early afternoon. -Patient reports feeling okay at discharge he lives with his wife, he went to bed last night, woke up this morning and felt faint, walked to the bathroom and remembers sitting down on his commode, does not recall the events after this, subsequently found down on the bathroom floor by family unresponsive, EMS was called patient was found to be tachycardic with a thready pulse and decreased respiration, subsequently regained his consciousness and was brought to ED. there was reported incontinence of stool on the bathroom floor. -Patient denies any complaints at this time, he denies any fevers or chills, no dyspnea chest pain or shortness of breath no palpitations no nausea vomiting or diarrhea  ED Course: Blood pressure was 103/58 on arrival, rest of vital signs were stable, labs noted stage IV-V kidney disease otherwise unremarkable labs   Assessment & Plan:   Principal Problem:   Syncope and collapse  Active Problems:   Chronic kidney disease, stage V (HCC)   Essential hypertension   Chronic obstructive pulmonary disease (COPD) (HCC)   Syncope  1 syncope/dizziness Patient presented with a syncopal episode and noted on arrival to have a blood pressure of 103/58.  Patient with a history of stage IV stage V chronic kidney disease.  Patient does endorse feeling lightheaded and dizzy prior to syncopal episode.  Patient also stating that from supine to sitting position has had some bouts of dizziness.  Patient denies any tongue biting no jerking motions of extremities prior to admission.  Patient does note he had some bowel incontinence.  Orthostatics were ordered on admission however not done.  2D echo pending.  Patient with no signs or symptoms of infection.  Urinalysis with large leukocytes, greater than 50 WBC.  Urine cultures pending.  Patient still with dizziness.  2D echo pending.  Head CT which was done was unremarkable.  Orthostatics were checked yesterday which were negative.  Continue to hold Flomax and will not resume due to concerns for orthostatic hypotension.  Check MRI of the head.  We will hold off on antibiotics until urine cultures have resulted.  Meclizine as needed dizziness.  Saline lock IV fluids.  Follow.   2.  Chronic kidney disease stage V Stable.  Continue calcitriol and bicarb tablets.  Currently euvolemic.  Follow.  3.  COPD Stable.  4.  Neurogenic bladder with chronic retention Patient requiring I and O catheterizations intermittently at baseline.  Foley catheter ordered and likely continue during the hospitalization and discontinue on discharge.  Continue to hold Flomax as it is felt this could exacerbate orthostatic hypotension.  5.  Anemia Likely dilutional.  Patient denies any overt bleeding.  Anemia panel consistent with anemia of chronic disease.  H&H stable at 9.1. Transfusion threshold hemoglobin less than 7.  8.  Probable UTI versus bacteria in urine  Urinalysis positive leukocytes, nitrite negative, WBC greater than 50.  Urine cultures pending.  We will hold off on antibiotics pending urine cultures.   DVT prophylaxis: Heparin Code Status: Full Family Communication: Updated patient.  No family at bedside. Disposition Plan: To be determined.   Consultants:   None  Procedures:   2D echo pending  CT head CT C-spine 01/08/2019  Chest x-ray 01/08/2019  Antimicrobials:    Subjective: Patient on the telephone..  Patient with complaints of dizziness last night.  Denies any positional change with dizziness.  Patient denies any chest pain.  No shortness of breath.  Objective: Vitals:   01/09/19 1232 01/09/19 2118 01/10/19 0028 01/10/19 0457  BP: (!) 146/66 (!) 143/71 (!) 149/66 (!) 145/78  Pulse: 72 78 76 80  Resp: 19 16 16 17   Temp: 97.9 F (36.6 C) 98.1 F (36.7 C) 98.1 F (36.7 C) (!) 97.4 F (36.3 C)  TempSrc: Oral Oral Oral Oral  SpO2: 100% 97% 95% 93%  Weight:    69 kg    Intake/Output Summary (Last 24 hours) at 01/10/2019 1041 Last data filed at 01/10/2019 0800 Gross per 24 hour  Intake 1593.21 ml  Output 1481 ml  Net 112.21 ml   Filed Weights   01/09/19 0511 01/10/19 0457  Weight: 71 kg 69 kg    Examination:  General exam: NAD  Respiratory system: Clear to auscultation bilaterally.  No wheezes, no crackles, no rhonchi.   Cardiovascular system: Regular rate rhythm no murmurs rubs or gallops.  No JVD.  No lower extremity edema.  Gastrointestinal system: Abdomen is soft, nontender, nondistended, positive bowel sounds.  No rebound.  No guarding.  Central nervous system: Alert and oriented.  Right lower extremity weakness chronic per patient.  Extremities: Symmetric 5 x 5 power. Skin: No rashes, lesions or ulcers Psychiatry: Judgement and insight appear normal. Mood & affect appropriate.     Data Reviewed: I have personally reviewed following labs and imaging studies  CBC: Recent Labs  Lab 01/08/19 1258  01/09/19 0438 01/10/19 0511  WBC 5.4 4.5 4.4  NEUTROABS 4.1  --   --   HGB 11.1* 9.3* 9.1*  HCT 36.9* 30.0* 28.9*  MCV 97.1 94.3 93.5  PLT 188 175 [id number]*   Basic Metabolic Panel: Recent Labs  Lab 01/08/19 1258 01/09/19 0438 01/10/19 0511  NA 138 137 137  K 5.1 4.9 4.5  CL 108 107 108  CO2 20* 19* 19*  GLUCOSE 104* 77 97  BUN 46* 51* 47*  CREATININE 3.49* 3.38* 3.16*  CALCIUM 10.1 9.2 9.3  MG 2.0  --   --   PHOS  --   --  2.8   GFR: Estimated Creatinine Clearance: 15.8 mL/min (A) (by C-G formula based on SCr of 3.16 mg/dL (H)). Liver Function Tests: Recent Labs  Lab 01/08/19 1258 01/09/19 0438 01/10/19 0511  AST 20 17  --   ALT 10 10  --   ALKPHOS 46 39  --   BILITOT 0.7 0.6  --   PROT 8.1 6.4*  --   ALBUMIN 3.6 2.9* 2.8*   No results for input(s): LIPASE, AMYLASE in the last 168 hours. No results for input(s): AMMONIA in the last 168 hours. Coagulation Profile: No results for input(s):  INR, PROTIME in the last 168 hours. Cardiac Enzymes: No results for input(s): CKTOTAL, CKMB, CKMBINDEX, TROPONINI in the last 168 hours. BNP (last 3 results) No results for input(s): PROBNP in the last 8760 hours. HbA1C: No results for input(s): HGBA1C in the last 72 hours. CBG: Recent Labs  Lab 01/08/19 1247  GLUCAP 101*   Lipid Profile: No results for input(s): CHOL, HDL, LDLCALC, TRIG, CHOLHDL, LDLDIRECT in the last 72 hours. Thyroid Function Tests: No results for input(s): TSH, T4TOTAL, FREET4, T3FREE, THYROIDAB in the last 72 hours. Anemia Panel: Recent Labs    01/10/19 0511  VITAMINB12 477  FOLATE 8.3  FERRITIN 494*  TIBC 185*  IRON 47   Sepsis Labs: No results for input(s): PROCALCITON, LATICACIDVEN in the last 168 hours.  Recent Results (from the past 240 hour(s))  SARS CORONAVIRUS 2 Nasal Swab Aptima Multi Swab     Status: None   Collection Time: 01/09/19 12:25 PM   Specimen: Aptima Multi Swab; Nasal Swab  Result Value Ref Range Status   SARS  Coronavirus 2 NEGATIVE NEGATIVE Final    Comment: (NOTE) SARS-CoV-2 target nucleic acids are NOT DETECTED. The SARS-CoV-2 RNA is generally detectable in upper and lower respiratory specimens during the acute phase of infection. Negative results do not preclude SARS-CoV-2 infection, do not rule out co-infections with other pathogens, and should not be used as the sole basis for treatment or other patient management decisions. Negative results must be combined with clinical observations, patient history, and epidemiological information. The expected result is Negative. Fact Sheet for Patients: SugarRoll.be Fact Sheet for Healthcare Providers: https://www.woods-mathews.com/ This test is not yet approved or cleared by the [place] FDA and  has been authorized for detection and/or diagnosis of SARS-CoV-2 by FDA under an Emergency Use Authorization (EUA). This EUA will remain  in effect (meaning this test can be used) for the duration of the COVID-19 declaration under Section 56 4(b)(1) of the Act, 21 U.S.C. section 360bbb-3(b)(1), unless the authorization is terminated or revoked sooner. Performed at [facility], [place] [address], [place], [place] [address]          Radiology Studies: Dg Chest 2 View  Result Date: 01/08/2019 CLINICAL DATA:  Syncope versus seizure EXAM: CHEST - 2 VIEW COMPARISON:  November 29, 2018 FINDINGS: The lungs are clear without focal consolidation, edema, effusion or pneumothorax. Cardiomediastinal silhouette is within normal limits for size. No acute osseous findings. Again noted is bilateral shoulder arthropathy. Degenerative changes seen in the midthoracic spine. No acute osseous findings. IMPRESSION: No acute cardiopulmonary process. Electronically Signed   By: Prudencio Pair M.D.   On: 01/08/2019 13:09   Ct Head Wo Contrast  Result Date: 01/08/2019 CLINICAL DATA:  Possible seizure versus syncope EXAM: CT HEAD  WITHOUT CONTRAST TECHNIQUE: Contiguous axial images were obtained from the base of the skull through the vertex without intravenous contrast. COMPARISON:  10/09/2018 FINDINGS: Brain: Moderate atrophy unchanged. Extensive white matter disease bilaterally is stable from the prior study. Chronic lacunar infarction right thalamus is unchanged. Negative for acute infarct, hemorrhage, mass. Vascular: Negative for hyperdense vessel. Atherosclerotic calcification in the cavernous carotid bilaterally. Skull: Negative Sinuses/Orbits: Extensive chronic thickening of the left maxillary sinus and left sphenoid sinus due to chronic inflammation. Prior sinus surgery on the left. No orbital lesion. Other: None IMPRESSION: Moderate atrophy and moderate to extensive chronic ischemic change. No acute abnormality and no change from the prior CT. Electronically Signed   By: Franchot Gallo M.D.  On: 01/08/2019 14:49   Ct Cervical Spine Wo Contrast  Result Date: 01/08/2019 CLINICAL DATA:  Seizure versus syncope. EXAM: CT CERVICAL SPINE WITHOUT CONTRAST TECHNIQUE: Multidetector CT imaging of the cervical spine was performed without intravenous contrast. Multiplanar CT image reconstructions were also generated. COMPARISON:  None. FINDINGS: Alignment: Straightening of the cervical lordosis. Skull base and vertebrae: No acute fracture. No primary bone lesion or focal pathologic process. Soft tissues and spinal canal: No prevertebral fluid or swelling. No visible canal hematoma. Disc levels: Multilevel osteoarthritic changes with disc space narrowing remodeling of vertebral bodies and osteophytosis. Upper chest: Negative. Other: Carotid artery calcifications. IMPRESSION: 1. No evidence of acute traumatic injury to the cervical spine. 2. Multilevel osteoarthritic changes of the cervical spine. Electronically Signed   By: Fidela Salisbury M.D.   On: 01/08/2019 14:53        Scheduled Meds: . calcitRIOL  0.5 mcg Oral Daily  .  febuxostat  20 mg Oral Daily  . heparin  5,000 Units Subcutaneous Q8H  . sodium bicarbonate  1,300 mg Oral TID   Continuous Infusions: . sodium chloride 75 mL/hr at 01/09/19 1339     LOS: 0 days    Time spent: 35 minutes    Irine Seal, MD [facility]  If 7PM-7AM, please contact night-coverage [email or web address] 01/10/2019, 10:41 AM

## 2019-01-10 NOTE — Progress Notes (Signed)
OT Cancellation Note  Patient Details Name: Ray Howard MRN: [id number] DOB: [birth year]   Cancelled Treatment:    Reason Eval/Treat Not Completed: Fatigue/lethargy limiting ability to participate. Pt reports "I just had  A spell where I felt dizzy just laying in bed" Pt also reports that he is not up to an OT at this time and that he feels too weak and tired to participate right now and requested OT return later today or in the morning. OT will try again at the next appropriate/available time  Britt Bottom 01/10/2019, 1:50 PM

## 2019-01-10 NOTE — Evaluation (Signed)
Physical Therapy Evaluation Patient Details Name: Ray Howard MRN: [id number] DOB: 02/13/1928 Today's Date: 01/10/2019   History of Present Illness  85 yo male admitted after syncope in bathroom with D/C 1 day prior to admission from [facility] after AKI with hypotension. PMhx: neurogenic bladder with retention, CKD, COPD, anemia, gout  Clinical Impression  Pt pleasant and willing to mobilize but reports assist for transfers and higher surfaces at home. Pt states he can normally get up and walk around the house with RW on his own but demonstrated significant struggle weakness and LOB with mobility today. Pt able to get up and walk to chair but echo arrived and pt had to be returned to bed. Pt with decreased functional mobility, balance and gait who will benefit from acute therapy to maximize safety and function to decrease burden of care and fall risk.   Orthostatic BPs  Supine 141/69  Sitting 145/82     Standing 122/68  Standing after 3 min 113/72       Follow Up Recommendations Home health PT;Supervision/Assistance - 24 hour    Equipment Recommendations  None recommended by PT    Recommendations for Other Services       Precautions / Restrictions Precautions Precautions: Fall Precaution Comments: bil knee flexion and hip flexion limited ROM      Mobility  Bed Mobility Overal bed mobility: Needs Assistance Bed Mobility: Supine to Sit     Supine to sit: Mod assist     General bed mobility comments: mod assist to pivot pt to EOB and elevate trunk with delayed transition from supine to sit with posterior lean  Transfers Overall transfer level: Needs assistance   Transfers: Sit to/from Stand;Stand Pivot Transfers Sit to Stand: Min assist;Max assist Stand pivot transfers: Min assist       General transfer comment: pt able to rise from elevated bed with min assist. Rise from recliner with max assist with RLE blocked assist for anterior translation and rise due to  limited bil LE flexion. Min assist with RW to pivot chair to bed  Ambulation/Gait Ambulation/Gait assistance: Min assist Gait Distance (Feet): 15 Feet Assistive device: Rolling walker (2 wheeled) Gait Pattern/deviations: Step-through pattern;Decreased stride length;Trunk flexed   Gait velocity interpretation: 1.31 - 2.62 ft/sec, indicative of limited community ambulator General Gait Details: pt with short slow steps with reliance on RW and flexed trunk, increased time with assist to steer around obstacles.  Stairs            Wheelchair Mobility    Modified Rankin (Stroke Patients Only)       Balance Overall balance assessment: Needs assistance   Sitting balance-Leahy Scale: Poor Sitting balance - Comments: initial mod assist for sitting balance with progression to minguard with increased time to transition from posterior lean to midline with bil UE support   Standing balance support: Bilateral upper extremity supported Standing balance-Leahy Scale: Poor                               Pertinent Vitals/Pain Pain Assessment: No/denies pain    Home Living Family/patient expects to be discharged to:: Private residence Living Arrangements: Children;Spouse/significant other Available Help at Discharge: Family;Available 24 hours/day;Personal care attendant Type of Home: House       Home Layout: One level Home Equipment: [place] - 2 wheels;Bedside commode;Wheelchair - Education officer, community - power;Cane - single point      Prior Function Level of Independence: Needs assistance  Gait / Transfers Assistance Needed: Pt with assist to rise from low surfaces. Pt uses lift chair and elevated hospital bed at home. Pt walks with RW limited distance and power chair in community  ADL's / Homemaking Assistance Needed: family does all the homemaking, pt sponge bathes and then has assist of aide for shower        Hand Dominance        Extremity/Trunk Assessment    Upper Extremity Assessment Upper Extremity Assessment: Generalized weakness    Lower Extremity Assessment Lower Extremity Assessment: LLE deficits/detail;RLE deficits/detail RLE Deficits / Details: bil hip and knee flexion ROM limitations grossly 90degrees each LLE Deficits / Details: bil hip and knee flexion ROM limitations grossly 90degrees each       Communication   Communication: No difficulties  Cognition Arousal/Alertness: Awake/alert Behavior During Therapy: Flat affect Overall Cognitive Status: Within Functional Limits for tasks assessed                                        General Comments      Exercises     Assessment/Plan    PT Assessment Patient needs continued PT services  PT Problem List Decreased strength;Decreased mobility;Decreased safety awareness;Decreased range of motion;Decreased coordination;Decreased activity tolerance;Decreased balance;Decreased knowledge of use of DME       PT Treatment Interventions DME instruction;Therapeutic activities;Therapeutic exercise;Gait training;Functional mobility training;Patient/family education    PT Goals (Current goals can be found in the Care Plan section)  Acute Rehab PT Goals Patient Stated Goal: return home and watch Tv PT Goal Formulation: With patient Time For Goal Achievement: 01/24/19 Potential to Achieve Goals: Fair    Frequency Min 3X/week   Barriers to discharge        Co-evaluation               AM-PAC PT "6 Clicks" Mobility  Outcome Measure Help needed turning from your back to your side while in a flat bed without using bedrails?: A Little Help needed moving from lying on your back to sitting on the side of a flat bed without using bedrails?: A Lot Help needed moving to and from a bed to a chair (including a wheelchair)?: A Lot Help needed standing up from a chair using your arms (e.g., wheelchair or bedside chair)?: A Lot Help needed to walk in hospital room?: A  Little Help needed climbing 3-5 steps with a railing? : A Lot 6 Click Score: 14    End of Session Equipment Utilized During Treatment: Gait belt Activity Tolerance: Patient tolerated treatment well Patient left: in bed;with call bell/phone within reach;with bed alarm set Nurse Communication: Mobility status PT Visit Diagnosis: Other abnormalities of gait and mobility (R26.89);Muscle weakness (generalized) (M62.81);Unsteadiness on feet (R26.81)    Time: [id number] PT Time Calculation (min) (ACUTE ONLY): 38 min   Charges:   PT Evaluation $PT Eval Moderate Complexity: 1 Mod PT Treatments $Gait Training: 8-22 mins $Therapeutic Activity: 8-22 mins        Isobel Eisenhuth Pam Drown, PT Acute Rehabilitation Services Pager: [phone] Office: [place] 01/10/2019, 12:21 PM

## 2019-01-10 NOTE — Progress Notes (Addendum)
Patient having small but frequent amounts of urine in urinal.   Check patient's bedding-  Bedding also wet.  Changed bedding, placed condom cath on and check a bladder scan just to make sure no retention noted.   Bladder scan - 19 ml

## 2019-01-10 NOTE — Progress Notes (Signed)
  Echocardiogram 2D Echocardiogram has been performed.  Randa Lynn Marx Doig 01/10/2019, 10:47 AM

## 2019-01-11 DIAGNOSIS — Z79899 Other long term (current) drug therapy: Secondary | ICD-10-CM | POA: Diagnosis not present

## 2019-01-11 DIAGNOSIS — I10 Essential (primary) hypertension: Secondary | ICD-10-CM | POA: Diagnosis not present

## 2019-01-11 DIAGNOSIS — N390 Urinary tract infection, site not specified: Secondary | ICD-10-CM | POA: Diagnosis not present

## 2019-01-11 DIAGNOSIS — Z20828 Contact with and (suspected) exposure to other viral communicable diseases: Secondary | ICD-10-CM | POA: Diagnosis present

## 2019-01-11 DIAGNOSIS — Z87891 Personal history of nicotine dependence: Secondary | ICD-10-CM | POA: Diagnosis not present

## 2019-01-11 DIAGNOSIS — N3090 Cystitis, unspecified without hematuria: Secondary | ICD-10-CM | POA: Diagnosis present

## 2019-01-11 DIAGNOSIS — Z96641 Presence of right artificial hip joint: Secondary | ICD-10-CM | POA: Diagnosis present

## 2019-01-11 DIAGNOSIS — R338 Other retention of urine: Secondary | ICD-10-CM | POA: Diagnosis present

## 2019-01-11 DIAGNOSIS — R55 Syncope and collapse: Secondary | ICD-10-CM

## 2019-01-11 DIAGNOSIS — Z8614 Personal history of Methicillin resistant Staphylococcus aureus infection: Secondary | ICD-10-CM | POA: Diagnosis not present

## 2019-01-11 DIAGNOSIS — N185 Chronic kidney disease, stage 5: Secondary | ICD-10-CM | POA: Diagnosis present

## 2019-01-11 DIAGNOSIS — D638 Anemia in other chronic diseases classified elsewhere: Secondary | ICD-10-CM | POA: Diagnosis present

## 2019-01-11 DIAGNOSIS — K219 Gastro-esophageal reflux disease without esophagitis: Secondary | ICD-10-CM | POA: Diagnosis present

## 2019-01-11 DIAGNOSIS — M159 Polyosteoarthritis, unspecified: Secondary | ICD-10-CM | POA: Diagnosis present

## 2019-01-11 DIAGNOSIS — J449 Chronic obstructive pulmonary disease, unspecified: Secondary | ICD-10-CM | POA: Diagnosis present

## 2019-01-11 DIAGNOSIS — Z66 Do not resuscitate: Secondary | ICD-10-CM | POA: Diagnosis present

## 2019-01-11 DIAGNOSIS — R4020 Unspecified coma: Secondary | ICD-10-CM | POA: Diagnosis present

## 2019-01-11 DIAGNOSIS — I951 Orthostatic hypotension: Secondary | ICD-10-CM | POA: Diagnosis present

## 2019-01-11 DIAGNOSIS — B965 Pseudomonas (aeruginosa) (mallei) (pseudomallei) as the cause of diseases classified elsewhere: Secondary | ICD-10-CM | POA: Diagnosis present

## 2019-01-11 DIAGNOSIS — N319 Neuromuscular dysfunction of bladder, unspecified: Secondary | ICD-10-CM | POA: Diagnosis present

## 2019-01-11 DIAGNOSIS — I120 Hypertensive chronic kidney disease with stage 5 chronic kidney disease or end stage renal disease: Secondary | ICD-10-CM | POA: Diagnosis present

## 2019-01-11 DIAGNOSIS — Z8249 Family history of ischemic heart disease and other diseases of the circulatory system: Secondary | ICD-10-CM | POA: Diagnosis not present

## 2019-01-11 DIAGNOSIS — E8770 Fluid overload, unspecified: Secondary | ICD-10-CM | POA: Diagnosis not present

## 2019-01-11 DIAGNOSIS — Z809 Family history of malignant neoplasm, unspecified: Secondary | ICD-10-CM | POA: Diagnosis not present

## 2019-01-11 LAB — CBC
HCT: 31 % — ABNORMAL LOW (ref 39.0–52.0)
Hemoglobin: 9.6 g/dL — ABNORMAL LOW (ref 13.0–17.0)
MCH: 29.4 pg (ref 26.0–34.0)
MCHC: 31 g/dL (ref 30.0–36.0)
MCV: 94.8 fL (ref 80.0–100.0)
Platelets: 184 10*3/uL (ref 150–400)
RBC: 3.27 MIL/uL — ABNORMAL LOW (ref 4.22–5.81)
RDW: 13.5 % (ref 11.5–15.5)
WBC: 5.3 10*3/uL (ref 4.0–10.5)
nRBC: 0 % (ref 0.0–0.2)

## 2019-01-11 LAB — RENAL FUNCTION PANEL
Albumin: 2.9 g/dL — ABNORMAL LOW (ref 3.5–5.0)
Anion gap: 10 (ref 5–15)
BUN: 48 mg/dL — ABNORMAL HIGH (ref 8–23)
CO2: 21 mmol/L — ABNORMAL LOW (ref 22–32)
Calcium: 9.8 mg/dL (ref 8.9–10.3)
Chloride: 108 mmol/L (ref 98–111)
Creatinine, Ser: 3.08 mg/dL — ABNORMAL HIGH (ref 0.61–1.24)
GFR calc Af Amer: 20 mL/min — ABNORMAL LOW (ref >60)
GFR calc non Af Amer: 17 mL/min — ABNORMAL LOW (ref >60)
Glucose, Bld: 96 mg/dL (ref 70–99)
Phosphorus: 3.1 mg/dL (ref 2.5–4.6)
Potassium: 4.9 mmol/L (ref 3.5–5.1)
Sodium: 139 mmol/L (ref 135–145)

## 2019-01-11 MED ORDER — SODIUM CHLORIDE 0.9 % IV BOLUS: 250.0000 mL | Freq: Once | INTRAVENOUS | Status: DC

## 2019-01-11 NOTE — Plan of Care (Signed)

## 2019-01-11 NOTE — Progress Notes (Signed)
   Vital Signs MEWS/VS Documentation      01/11/2019 0429 01/11/2019 0700 01/11/2019 0756 01/11/2019 0819   MEWS Score:  0  0  0  0   MEWS Score Color:  Green  Green  Green  Green   Resp:  18  -  -  18   Pulse:  82  -  -  82   BP:  (!) 161/82  -  -  (!) 161/82   Temp:  (!) 97.5 F (36.4 C)  -  -  (!) 97.5 F (36.4 C)   O2 Device:  Room Air  -  -  -   Level of Consciousness:  -  -  Alert  -           Maud Deed Tobias-Diakun 01/11/2019,2:05 PM

## 2019-01-11 NOTE — Progress Notes (Signed)
Pt received order for saline bolus, notified on-call provider and MD about order for a bolus when there are concerns for volume overload and BPs are elevated. Awaiting confirmation on bolus order.

## 2019-01-11 NOTE — Evaluation (Signed)
Occupational Therapy Evaluation Patient Details Name: Ray Howard MRN: [id number] DOB: 11-08-1927 Today's Date: 01/11/2019    History of Present Illness 85 yo male admitted after syncope in bathroom with D/C 1 day prior to admission from [facility] after AKI with hypotension. PMhx: neurogenic bladder with retention, CKD, COPD, anemia, gout   Clinical Impression   PTA, pt was living with his wife and has an aide three days a week who assists with ADLs and pt's daughter and granddaughter come to perform IADLs; pt uses rollator for functional mobility. Pt currently requiring Min A for UB ADLs, Mod A for LB ADLs, and Min A for functional mobility with RW. Pt presenting with decreased strength and balance. Pt denies any dizziness and BP stable throughout. Pt would benefit from further acute OT to facilitate safe dc. Recommend dc to home with HHOT for further OT to optimize safety, independence with ADLs, and return to PLOF and to decrease caregiver burden.      Follow Up Recommendations  Home health OT;Supervision/Assistance - 24 hour    Equipment Recommendations  None recommended by OT    Recommendations for Other Services PT consult     Precautions / Restrictions Precautions Precautions: Fall Precaution Comments: bil knee flexion and hip flexion limited ROM Restrictions Weight Bearing Restrictions: No      Mobility Bed Mobility Overal bed mobility: Needs Assistance Bed Mobility: Supine to Sit     Supine to sit: Mod assist     General bed mobility comments: Mod A to bring BLEs towards EOB and then pull hips towards EOB with bed pad. Also assistance for elevating trunk and presenting with posterior lean  Transfers Overall transfer level: Needs assistance   Transfers: Sit to/from Stand;Stand Pivot Transfers Sit to Stand: Min guard;From elevated surface         General transfer comment: Min Guard A for sit<>stand from high elevated seat.     Balance Overall balance  assessment: Needs assistance Sitting-balance support: Feet supported;Bilateral upper extremity supported Sitting balance-Leahy Scale: Poor Sitting balance - Comments: posterior lean. Increased time to gain balance at EOB and then able to sit with Min Guard A   Standing balance support: Bilateral upper extremity supported Standing balance-Leahy Scale: Poor Standing balance comment: Reliant on UE support                           ADL either performed or assessed with clinical judgement   ADL Overall ADL's : Needs assistance/impaired Eating/Feeding: Set up;Sitting   Grooming: Set up;Supervision/safety;Sitting   Upper Body Bathing: Minimal assistance;Sitting   Lower Body Bathing: Moderate assistance;Sit to/from stand   Upper Body Dressing : Minimal assistance;Sitting   Lower Body Dressing: Moderate assistance;Sit to/from stand   Toilet Transfer: Minimal assistance;Ambulation;RW(simulated to recliner)           Functional mobility during ADLs: Minimal assistance;Rolling walker General ADL Comments: Pt presenting with decreased strength and balance. However, feel he is close to baseline     Vision         Perception     Praxis      Pertinent Vitals/Pain Pain Assessment: No/denies pain     Hand Dominance     Extremity/Trunk Assessment Upper Extremity Assessment Upper Extremity Assessment: Generalized weakness   Lower Extremity Assessment Lower Extremity Assessment: Defer to PT evaluation RLE Deficits / Details: bil hip and knee flexion ROM limitations grossly 90degrees each LLE Deficits / Details: bil hip and knee flexion  ROM limitations grossly 90degrees each   Cervical / Trunk Assessment Cervical / Trunk Assessment: Kyphotic   Communication Communication Communication: HOH   Cognition Arousal/Alertness: Awake/alert Behavior During Therapy: Flat affect Overall Cognitive Status: Within Functional Limits for tasks assessed                                      General Comments  BP supine in bed 137/67 and sitting at EOB 143/74    Exercises     Shoulder Instructions      Home Living Family/patient expects to be discharged to:: Private residence Living Arrangements: Children;Spouse/significant other Available Help at Discharge: Family;Available 24 hours/day;Personal care attendant Type of Home: House       Home Layout: One level     Bathroom Shower/Tub: Teacher, early years/pre: Standard     Home Equipment: Environmental consultant - 2 wheels;Bedside commode;Wheelchair - Education officer, community - power;Cane - single point(hospital bed)          Prior Functioning/Environment Level of Independence: Needs assistance  Gait / Transfers Assistance Needed: Pt with assist to rise from low surfaces. Pt uses lift chair and elevated hospital bed at home. Pt walks with RW limited distance and power chair in community ADL's / Homemaking Assistance Needed: Daughter and granddaughter performs IADLs. Aide for showers. Pt performing other ADLs slowly.    Comments: Has a CNA who comes three days a weeke to assist with shower        OT Problem List: Decreased strength;Decreased range of motion;Decreased activity tolerance;Impaired balance (sitting and/or standing);Decreased knowledge of use of DME or AE;Decreased knowledge of precautions      OT Treatment/Interventions: Self-care/ADL training;Therapeutic exercise;Energy conservation;DME and/or AE instruction    OT Goals(Current goals can be found in the care plan section) Acute Rehab OT Goals Patient Stated Goal: return home and watch Tv OT Goal Formulation: With patient Time For Goal Achievement: 01/25/19 Potential to Achieve Goals: Good  OT Frequency: Min 2X/week   Barriers to D/C:            Co-evaluation              AM-PAC OT "6 Clicks" Daily Activity     Outcome Measure Help from another person eating meals?: None Help from another person taking care of  personal grooming?: A Little Help from another person toileting, which includes using toliet, bedpan, or urinal?: A Little Help from another person bathing (including washing, rinsing, drying)?: A Lot Help from another person to put on and taking off regular upper body clothing?: A Little Help from another person to put on and taking off regular lower body clothing?: A Lot 6 Click Score: 17   End of Session Equipment Utilized During Treatment: Gait belt;Rolling walker Nurse Communication: Mobility status  Activity Tolerance: Patient limited by fatigue Patient left: in chair;with call bell/phone within reach;with chair alarm set  OT Visit Diagnosis: Unsteadiness on feet (R26.81);Other abnormalities of gait and mobility (R26.89);Muscle weakness (generalized) (M62.81)                Time: [id number] OT Time Calculation (min): 21 min Charges:  OT General Charges $OT Visit: 1 Visit OT Evaluation $OT Eval Moderate Complexity: [place], OTR/L Acute Rehab Pager: [phone] Office: [place] 01/11/2019, 12:32 PM

## 2019-01-11 NOTE — Progress Notes (Signed)
Physical Therapy note: pt evaluated yesterday with orthostatic vitals documented in note. Pt does not present with vestibular symptoms and will defer full vestibular evaluation at this time but will continue to follow acutely as documented in evaluation 01/10/19 Elwyn Reach, PT Acute Rehabilitation Services Pager: [phone] Office: [phone]

## 2019-01-11 NOTE — Progress Notes (Signed)
PROGRESS NOTE    Ray Howard  [id number] DOB: Feb 25, 1928 DOA: 01/08/2019 PCP: Hennie Duos, MD    Brief Narrative:  HPI per Dr. Serita Sheller is a 85 y.o. male with medical history significant for stage 5 chronic kidney disease, not on dialysis yet followed by nephrology yet [place], COPD, anemia of chronic disease, neurogenic bladder with urinary retention requiring self-catheterization, was just discharged from [facility] yesterday after admission for hypotension, acute kidney injury and hyperkalemia.  Patient's baseline creatinine is around 3, he was hospitalized a week ago with a creatinine of 5.3, potassium of 6 and hypotension, he was treated with IV fluids, sodium bicarbonate, followed by nephrology, renal ultrasound did not show hydronephrosis, he was also found to have Pseudomonas in his urine culture which was felt to be a contaminant by infectious disease, ultimately his creatinine improved to 3.0 and he was discharged home yesterday early afternoon. -Patient reports feeling okay at discharge he lives with his wife, he went to bed last night, woke up this morning and felt faint, walked to the bathroom and remembers sitting down on his commode, does not recall the events after this, subsequently found down on the bathroom floor by family unresponsive, EMS was called patient was found to be tachycardic with a thready pulse and decreased respiration, subsequently regained his consciousness and was brought to ED. there was reported incontinence of stool on the bathroom floor. -Patient denies any complaints at this time, he denies any fevers or chills, no dyspnea chest pain or shortness of breath no palpitations no nausea vomiting or diarrhea  ED Course: Blood pressure was 103/58 on arrival, rest of vital signs were stable, labs noted stage IV-V kidney disease otherwise unremarkable labs   Assessment & Plan:   Principal Problem:   Syncope and collapse  Active Problems:   Chronic kidney disease, stage V (HCC)   Essential hypertension   Chronic obstructive pulmonary disease (COPD) (HCC)   Syncope   Acute lower UTI  1 syncope/dizziness Patient presented with a syncopal episode and noted on arrival to have a blood pressure of 103/58.  Patient with a history of stage IV stage V chronic kidney disease.  Patient does endorse feeling lightheaded and dizzy prior to syncopal episode.  Patient also stating that from supine to sitting position has had some bouts of dizziness.  Patient denies any tongue biting no jerking motions of extremities prior to admission.  Patient does note he had some bowel incontinence.  Orthostatics were ordered on admission however not done. Patient with no signs or symptoms of infection.  Urinalysis with large leukocytes, greater than 50 WBC.  Urine cultures pending.  Patient still with dizziness.  2D echo with EF of greater than 65%, moderately increased left ventricular wall thickness, no stenosis of aortic valve.  No wall motion abnormalities. Head CT which was done was unremarkable.  MRI head was negative for any acute abnormalities.  Patient still noted to be orthostatic.  We will continue to hold Flomax and likely will not resume on discharge due to concerns for orthostatic hypotension.  Patient with urine cultures pending.  Patient afebrile.  We will hold off on antibiotics at this time.  Due to ongoing dizziness will consult with neurology for further evaluation and management.  Patient placed on meclizine as needed.  IV fluids have been saline locked due to chronic kidney disease stage V and concerns for volume overload.  Follow.   2.  Orthostatic hypotension. Patient noted  to be orthostatic.  Patient had presented with syncope and some complaints of dizziness.  Patient with dizziness while laying flat and not necessarily orthostatic.  Orthostatic vital signs are positive.  Patient however currently looks euvolemic on  examination.  Patient with chronic kidney disease stage V and due to concerns for volume overload IV fluids have been saline locked.  TED hose.  Follow.  3.  Chronic kidney disease stage V Stable.  Continue calcitriol and bicarb tablets.  Currently euvolemic.  Follow.  4.  COPD Stable.  5.  Neurogenic bladder with chronic retention Patient requiring I and O catheterizations intermittently at baseline.  Foley catheter ordered and likely continue during the hospitalization and discontinue on discharge.  Continue to hold Flomax as it is felt this could exacerbate orthostatic hypotension.  Would likely not resume Flomax on discharge.  6.  Anemia Likely dilutional.  Patient denies any overt bleeding.  Anemia panel consistent with anemia of chronic disease.  H&H stable at 9.6. Transfusion threshold hemoglobin less than 7.  7.  Probable UTI versus bacteria in urine Urinalysis positive leukocytes, nitrite negative, WBC greater than 50.  Urine cultures pending.  We will hold off on antibiotics pending urine cultures.   DVT prophylaxis: Heparin Code Status: Full Family Communication: Updated patient.  No family at bedside. Disposition Plan: To be determined.   Consultants:   None  Procedures:   2D echo 01/10/2019  CT head CT C-spine 01/08/2019  Chest x-ray 01/08/2019  MRI head 01/10/2019  Antimicrobials:    Subjective: Patient lying in bed.  Patient with complaints of dizziness twice last night.  Patient states no significant improvement with dizziness.  Denies any positional changes with dizziness however does endorse a spinning sensation.  No chest pain.  No shortness of breath.  Patient still noted to be orthostatic.  Objective: Vitals:   01/10/19 1340 01/10/19 1947 01/11/19 0429 01/11/19 0819  BP: (!) 172/72 (!) 152/70 (!) 161/82 (!) 161/82  Pulse: 70 80 82 82  Resp:  18 18 18   Temp: 97.9 F (36.6 C) 98 F (36.7 C) (!) 97.5 F (36.4 C) (!) 97.5 F (36.4 C)  TempSrc: Oral  Oral Oral Oral  SpO2: 100% 99% 98%   Weight:   72 kg 72 kg  Height:    5\' 11"  (1.803 m)    Intake/Output Summary (Last 24 hours) at 01/11/2019 1059 Last data filed at 01/11/2019 0756 Gross per 24 hour  Intake 1467.67 ml  Output 1800 ml  Net -332.33 ml   Filed Weights   01/10/19 0457 01/11/19 0429 01/11/19 0819  Weight: 69 kg 72 kg 72 kg    Examination:  General exam: NAD  Respiratory system: CTAB anterior lung fields.  No wheezes, no crackles, no rhonchi.   Cardiovascular system: RRR no murmurs rubs or gallops.  No JVD.  No lower extremity edema.  Gastrointestinal system: Abdomen is nontender, nondistended, soft, positive bowel sounds.  No rebound.  No guarding.  Central nervous system: Alert and oriented.  Right lower extremity weakness chronic per patient.  Extremities: Symmetric 5 x 5 power. Skin: No rashes, lesions or ulcers Psychiatry: Judgement and insight appear normal. Mood & affect appropriate.     Data Reviewed: I have personally reviewed following labs and imaging studies  CBC: Recent Labs  Lab 01/08/19 1258 01/09/19 0438 01/10/19 0511 01/11/19 0230  WBC 5.4 4.5 4.4 5.3  NEUTROABS 4.1  --   --   --   HGB 11.1* 9.3* 9.1* 9.6*  HCT 36.9* 30.0* 28.9* 31.0*  MCV 97.1 94.3 93.5 94.8  PLT 188 175 147* [id number]   Basic Metabolic Panel: Recent Labs  Lab 01/08/19 1258 01/09/19 0438 01/10/19 0511 01/11/19 0230  NA 138 137 137 139  K 5.1 4.9 4.5 4.9  CL 108 107 108 108  CO2 20* 19* 19* 21*  GLUCOSE 104* 77 97 96  BUN 46* 51* 47* 48*  CREATININE 3.49* 3.38* 3.16* 3.08*  CALCIUM 10.1 9.2 9.3 9.8  MG 2.0  --   --   --   PHOS  --   --  2.8 3.1   GFR: Estimated Creatinine Clearance: 16.9 mL/min (A) (by C-G formula based on SCr of 3.08 mg/dL (H)). Liver Function Tests: Recent Labs  Lab 01/08/19 1258 01/09/19 0438 01/10/19 0511 01/11/19 0230  AST 20 17  --   --   ALT 10 10  --   --   ALKPHOS 46 39  --   --   BILITOT 0.7 0.6  --   --   PROT 8.1 6.4*  --    --   ALBUMIN 3.6 2.9* 2.8* 2.9*   No results for input(s): LIPASE, AMYLASE in the last 168 hours. No results for input(s): AMMONIA in the last 168 hours. Coagulation Profile: No results for input(s): INR, PROTIME in the last 168 hours. Cardiac Enzymes: No results for input(s): CKTOTAL, CKMB, CKMBINDEX, TROPONINI in the last 168 hours. BNP (last 3 results) No results for input(s): PROBNP in the last 8760 hours. HbA1C: No results for input(s): HGBA1C in the last 72 hours. CBG: Recent Labs  Lab 01/08/19 1247  GLUCAP 101*   Lipid Profile: No results for input(s): CHOL, HDL, LDLCALC, TRIG, CHOLHDL, LDLDIRECT in the last 72 hours. Thyroid Function Tests: No results for input(s): TSH, T4TOTAL, FREET4, T3FREE, THYROIDAB in the last 72 hours. Anemia Panel: Recent Labs    01/10/19 0511  VITAMINB12 477  FOLATE 8.3  FERRITIN 494*  TIBC 185*  IRON 47   Sepsis Labs: No results for input(s): PROCALCITON, LATICACIDVEN in the last 168 hours.  Recent Results (from the past 240 hour(s))  SARS CORONAVIRUS 2 Nasal Swab Aptima Multi Swab     Status: None   Collection Time: 01/09/19 12:25 PM   Specimen: Aptima Multi Swab; Nasal Swab  Result Value Ref Range Status   SARS Coronavirus 2 NEGATIVE NEGATIVE Final    Comment: (NOTE) SARS-CoV-2 target nucleic acids are NOT DETECTED. The SARS-CoV-2 RNA is generally detectable in upper and lower respiratory specimens during the acute phase of infection. Negative results do not preclude SARS-CoV-2 infection, do not rule out co-infections with other pathogens, and should not be used as the sole basis for treatment or other patient management decisions. Negative results must be combined with clinical observations, patient history, and epidemiological information. The expected result is Negative. Fact Sheet for Patients: SugarRoll.be Fact Sheet for Healthcare Providers: https://www.woods-mathews.com/ This  test is not yet approved or cleared by the [place] FDA and  has been authorized for detection and/or diagnosis of SARS-CoV-2 by FDA under an Emergency Use Authorization (EUA). This EUA will remain  in effect (meaning this test can be used) for the duration of the COVID-19 declaration under Section 56 4(b)(1) of the Act, 21 U.S.C. section 360bbb-3(b)(1), unless the authorization is terminated or revoked sooner. Performed at [facility], [place] [address] [place], [place] [address]          Radiology Studies: Mr Brain Wo Contrast  Result  Date: 01/10/2019 CLINICAL DATA:  Persistent vertigo. EXAM: MRI HEAD WITHOUT CONTRAST TECHNIQUE: Multiplanar, multiecho pulse sequences of the brain and surrounding structures were obtained without intravenous contrast. COMPARISON:  Head CT 01/08/2019 FINDINGS: Brain: Diffusion imaging does not show any acute or subacute infarction. There chronic small-vessel ischemic changes of the pons. Few old small vessel cerebellar infarctions. 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 Extensive chronic small-vessel ischemic changes throughout the deep and subcortical white matter. Multiple dilated perivascular spaces affecting the cerebral hemispheres. No large vessel territory infarction. No mass lesion, acute hemorrhage, hydrocephalus or extra-axial collection. Punctate foci of hemosiderin associated with many of the old small vessel infarctions. Vascular: Major vessels at the base of the brain show flow. Skull and upper cervical spine: Negative Sinuses/Orbits: Clear/normal. Other: No fluid in the mastoid air cells. IMPRESSION: No acute or reversible finding. No specific abnormality seen to explain vertigo. Extensive chronic small-vessel ischemic changes affecting the brainstem, right thalamus and cerebral hemispheric white matter. Electronically Signed   By: Nelson Chimes M.D.   On: 01/10/2019 14:58        Scheduled Meds: . calcitRIOL  0.5 mcg Oral Daily  .  febuxostat  20 mg Oral Daily  . heparin  5,000 Units Subcutaneous Q8H  . sodium bicarbonate  1,300 mg Oral TID   Continuous Infusions:    LOS: 0 days    Time spent: 35 minutes    Irine Seal, MD [facility]  If 7PM-7AM, please contact night-coverage [email or web address] 01/11/2019, 10:59 AM

## 2019-01-11 NOTE — Consult Note (Signed)
NEUROLOGY CONSULT  Reason for Consult:neurologic opinion for syncope Referring Physician: Dr Grandville Silos  CC: "Im better today; just tired"  HPI: Ray Howard is an 85 y.o. male PMH of COPD, HTN, GERD, osteoarthritis, anemia of chronic dz with CKD5, not yet on dialysis, but followed closely by Nephrology. The pt was admitted on 01/08/19 for syncope and collapse. He tells me that he had been felling generally unwell and suffering from diarrhea illness on the day of admit. He had urgency and incontinence with multiple trips to the bathroom per his own report. He tells me he began feeling dizzy while in the kitchen, but urgently had to use the bathroom again at the time he then felt himself "black out". He reports no CP, palpitations, SOB, seizures or aura during event, just simply "weak and dizzy". Family found him unresponsive on bathroom floor with incontinence of bowel. EMS found him with agonal respirations, tachycardia with weak pulses. He was gradually more responsive and alert by time of ER exam per EMR. His presenting BP was 103/58. No further hypotension or orthostatic hypotension seen   To note, he was just seen at [facility] for AKI and Hyperkalemia the day before admit to [facility].   Past Medical History Past Medical History:  Diagnosis Date  . ARF (acute renal failure) ([place])   . Bilateral hydronephrosis   . Chronic obstructive pulmonary disease (COPD) ([place])   . Essential hypertension   . GERD (gastroesophageal reflux disease)   . MRSA (methicillin resistant staph aureus) culture positive   . Osteoarthritis of multiple joints   . Transfusion history    with hip surgery    Past Surgical History Past Surgical History:  Procedure Laterality Date  . APPENDECTOMY     1950's  . HERNIA REPAIR     Inguinal  . TOTAL HIP ARTHROPLASTY Right     Family History Family History  Problem Relation Age of Onset  . Heart attack Father   . Cancer Brother     Social History    reports  that he quit smoking about 16 years ago. His smoking use included cigarettes. He started smoking about 71 years ago. He has never used smokeless tobacco. No history on file for alcohol and drug.  Allergies No Known Allergies  Home Medications Medications Prior to Admission  Medication Sig Dispense Refill  . acetaminophen (TYLENOL) 325 MG tablet Take 650 mg by mouth every 6 (six) hours as needed.     . calcitRIOL (ROCALTROL) 0.5 MCG capsule Take 1 capsule (0.5 mcg total) by mouth daily. 30 capsule 0  . febuxostat (ULORIC) 40 MG tablet Take 0.5 tablets (20 mg total) by mouth daily. Give 1/2 tablet ( 20 mg ) by mouth daily for Gout 15 tablet 0  . sodium bicarbonate 650 MG tablet Take 2 tablets (1,300 mg total) by mouth 3 (three) times daily. 180 tablet 0  . tamsulosin (FLOMAX) 0.4 MG CAPS capsule Take 1 capsule (0.4 mg total) by mouth daily. 30 capsule 0    Hospital Medications . calcitRIOL  0.5 mcg Oral Daily  . febuxostat  20 mg Oral Daily  . heparin  5,000 Units Subcutaneous Q8H  . sodium bicarbonate  1,300 mg Oral TID     ROS: Pt  General ROS: +fatigue, gen weakness and feeling unwell.  negative for - chills, fever, night sweats, weight gain or weight loss Psychological ROS: negative for - behavioral disorder, hallucinations, memory difficulties, mood swings or suicidal ideation Ophthalmic ROS: says he has  blurry vision quite often, but negative for double vision, eye pain or loss of vision ENT ROS: negative for - epistaxis, nasal discharge, oral lesions, sore throat, tinnitus or vertigo Allergy and Immunology ROS: negative for - hives or itchy/watery eyes Hematological and Lymphatic ROS: negative for - bleeding problems, bruising or swollen lymph nodes Endocrine ROS: negative for - galactorrhea, hair pattern changes, polydipsia/polyuria or temperature intolerance Respiratory ROS: negative for - cough, hemoptysis, shortness of breath or wheezing Cardiovascular ROS: negative for -  chest pain, dyspnea on exertion, edema or irregular heartbeat Gastrointestinal ROS: + diarrhea illness recently as well as urgency with stool incontinence. negative for - abdominal pain,hematemesis, nausea/vomiting Genito-Urinary ROS: +dysuria and CKD5; negative for - , hematuria, incontinence or urinary frequency/urgency Musculoskeletal ROS: +bilat shoulder pain, decreased ROM and arthritis pain. negative for - joint swelling or muscular weakness Neurological ROS: as noted in HPI Dermatological ROS: negative for rash and skin lesion changes; reports dry/itchy skin   Physical Examination:  Vitals:   01/10/19 1340 01/10/19 1947 01/11/19 0429 01/11/19 0819  BP: (!) 172/72 (!) 152/70 (!) 161/82 (!) 161/82  Pulse: 70 80 82 82  Resp:  18 18 18   Temp: 97.9 F (36.6 C) 98 F (36.7 C) (!) 97.5 F (36.4 C) (!) 97.5 F (36.4 C)  TempSrc: Oral Oral Oral Oral  SpO2: 100% 99% 98%   Weight:   72 kg 72 kg  Height:    5\' 11"  (1.803 m)    General - no acute distress; frail, thin appearing, face is sunken in along jaw line Heart - Regular rate and rhythm - no murmer Lungs - Clear to auscultation Abdomen - Soft - non tender Extremities - Distal pulses intact - no edema Skin - Warm and dry...very dry and pt observed itching with flaking skin. Skin is tenting throughout  Neurologic Examination:  Mental Status:  Alert, oriented, thought content appropriate. Speech without evidence of dysarthria or aphasia. Able to follow 3 step commands without difficulty.  Cranial Nerves:  II-bilateral visual fields intact III/IV/VI-Pupils were equal and reacted. Extraocular movements were full.  V/VII-no facial numbness and no facial weakness.  VIII-hearing normal.  X-normal speech and symmetrical palatal movement.  XII-midline tongue extension  Motor: Limited ROM in all joint, esp bilat shoulders d/t arthritic pain; finger deformity limits grip. However, there is no focal deficits. Moves all ext equal with  general and diffuse weakness. Tone and bulk:normal tone throughout; no atrophy noted Sensory: Intact to light touch in all extremities. Plantars: mute Cerebellar: No gross ataxia; limited d/t ROM/joit pain Gait: not tested; tells me he uses a walker at baseline d/t limited ROM/arthritic pain   LABORATORY STUDIES:  Basic Metabolic Panel: Recent Labs  Lab 01/08/19 1258 01/09/19 0438 01/10/19 0511 01/11/19 0230  NA 138 137 137 139  K 5.1 4.9 4.5 4.9  CL 108 107 108 108  CO2 20* 19* 19* 21*  GLUCOSE 104* 77 97 96  BUN 46* 51* 47* 48*  CREATININE 3.49* 3.38* 3.16* 3.08*  CALCIUM 10.1 9.2 9.3 9.8  MG 2.0  --   --   --   PHOS  --   --  2.8 3.1    Liver Function Tests: Recent Labs  Lab 01/08/19 1258 01/09/19 0438 01/10/19 0511 01/11/19 0230  AST 20 17  --   --   ALT 10 10  --   --   ALKPHOS 46 39  --   --   BILITOT 0.7 0.6  --   --  PROT 8.1 6.4*  --   --   ALBUMIN 3.6 2.9* 2.8* 2.9*   No results for input(s): LIPASE, AMYLASE in the last 168 hours. No results for input(s): AMMONIA in the last 168 hours.  CBC: Recent Labs  Lab 01/08/19 1258 01/09/19 0438 01/10/19 0511 01/11/19 0230  WBC 5.4 4.5 4.4 5.3  NEUTROABS 4.1  --   --   --   HGB 11.1* 9.3* 9.1* 9.6*  HCT 36.9* 30.0* 28.9* 31.0*  MCV 97.1 94.3 93.5 94.8  PLT 188 175 147* 184    Cardiac Enzymes: No results for input(s): CKTOTAL, CKMB, CKMBINDEX, TROPONINI in the last 168 hours.  BNP: Invalid input(s): POCBNP  CBG: Recent Labs  Lab 01/08/19 1247  GLUCAP 101*    Microbiology:   Coagulation Studies: No results for input(s): LABPROT, INR in the last 72 hours.  Urinalysis:  Recent Labs  Lab 01/09/19 0810  COLORURINE YELLOW  LABSPEC 1.010  PHURINE 7.0  GLUCOSEU NEGATIVE  HGBUR SMALL*  BILIRUBINUR NEGATIVE  KETONESUR NEGATIVE  PROTEINUR NEGATIVE  NITRITE NEGATIVE  LEUKOCYTESUR LARGE*    Lipid Panel:  No results found for: CHOL, TRIG, HDL, CHOLHDL, VLDL, LDLCALC  HgbA1C:  No  results found for: HGBA1C  Urine Drug Screen:      Component Value Date/Time   LABOPIA NONE DETECTED 01/09/2019 0810   COCAINSCRNUR NONE DETECTED 01/09/2019 0810   LABBENZ NONE DETECTED 01/09/2019 0810   AMPHETMU NONE DETECTED 01/09/2019 0810   THCU NONE DETECTED 01/09/2019 0810   LABBARB NONE DETECTED 01/09/2019 0810     Alcohol Level:  No results for input(s): ETH in the last 168 hours.  Miscellaneous labs:  EKG  EKG  IMAGING: Mr Brain Wo Contrast  Result Date: 01/10/2019 CLINICAL DATA:  Persistent vertigo. EXAM: MRI HEAD WITHOUT CONTRAST TECHNIQUE: Multiplanar, multiecho pulse sequences of the brain and surrounding structures were obtained without intravenous contrast. COMPARISON:  Head CT 01/08/2019 FINDINGS: Brain: Diffusion imaging does not show any acute or subacute infarction. There chronic small-vessel ischemic changes of the pons. Few old small vessel cerebellar infarctions. 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 Extensive chronic small-vessel ischemic changes throughout the deep and subcortical white matter. Multiple dilated perivascular spaces affecting the cerebral hemispheres. No large vessel territory infarction. No mass lesion, acute hemorrhage, hydrocephalus or extra-axial collection. Punctate foci of hemosiderin associated with many of the old small vessel infarctions. Vascular: Major vessels at the base of the brain show flow. Skull and upper cervical spine: Negative Sinuses/Orbits: Clear/normal. Other: No fluid in the mastoid air cells. IMPRESSION: No acute or reversible finding. No specific abnormality seen to explain vertigo. Extensive chronic small-vessel ischemic changes affecting the brainstem, right thalamus and cerebral hemispheric white matter. Electronically Signed   By: Nelson Chimes M.D.   On: 01/10/2019 14:58     Assessment/Plan: This is a 85yr old man admitted for syncope/collapse with unresponsive state in setting of recent AKI and hyperkalemia with CKD5  and diarrheal illness. On current exam he is completely A&Ox4, gives good history and has no focal deficits. He appears to be dehydrated given tenting skin, BUN as high as 52. MRI shows no acute findings, there are multiple old small lacunar strokes throughout. Pt denies any clinical stroke symptoms. There were no convulsions reported for a convulsive syncope spell. Pt has good recollection of events leading to the felling of "blacking out". His presenting BP was 103/58. No further hypotension or orthostatic hypotension seen. There are no vestibular symptoms seen on exam. Pt says  he still feels overall weak, tired and did have some recent dizziness overnight.   No further recommendations at this time besides supportive care as you are, perhaps push more PO fluids or consider IVF pending fluid balance goals in setting of CKD5.     Attending neurologist's note to follow  Yoshito Gaza Metzger-Cihelka, ARNP-C, ANVP-BC Pager: [phone]

## 2019-01-11 NOTE — Progress Notes (Signed)
Attempted orthostatic vital signs.  Will need further assistance from PT to obtain standing vital signs.  Lying: 164/80 (103) HR 84, SpO2 97. Sitting: 162/87(109) HR 94, SpO2 97.  Pt's last report of dizziness at 0400.  Will attempt standing VS with assistance of NT or PT.

## 2019-01-12 LAB — BASIC METABOLIC PANEL
Anion gap: 11 (ref 5–15)
BUN: 48 mg/dL — ABNORMAL HIGH (ref 8–23)
CO2: 21 mmol/L — ABNORMAL LOW (ref 22–32)
Calcium: 9.7 mg/dL (ref 8.9–10.3)
Chloride: 106 mmol/L (ref 98–111)
Creatinine, Ser: 3.11 mg/dL — ABNORMAL HIGH (ref 0.61–1.24)
GFR calc Af Amer: 20 mL/min — ABNORMAL LOW (ref >60)
GFR calc non Af Amer: 17 mL/min — ABNORMAL LOW (ref >60)
Glucose, Bld: 94 mg/dL (ref 70–99)
Potassium: 4.9 mmol/L (ref 3.5–5.1)
Sodium: 138 mmol/L (ref 135–145)

## 2019-01-12 LAB — CBC
HCT: 31.3 % — ABNORMAL LOW (ref 39.0–52.0)
Hemoglobin: 9.6 g/dL — ABNORMAL LOW (ref 13.0–17.0)
MCH: 29.1 pg (ref 26.0–34.0)
MCHC: 30.7 g/dL (ref 30.0–36.0)
MCV: 94.8 fL (ref 80.0–100.0)
Platelets: 171 10*3/uL (ref 150–400)
RBC: 3.3 MIL/uL — ABNORMAL LOW (ref 4.22–5.81)
RDW: 13.7 % (ref 11.5–15.5)
WBC: 4.5 10*3/uL (ref 4.0–10.5)
nRBC: 0 % (ref 0.0–0.2)

## 2019-01-12 MED ORDER — SODIUM CHLORIDE 0.9 % IV SOLN
1.0000 g | INTRAVENOUS | Status: DC
  Administered 2019-01-12: 1 g via INTRAVENOUS
  Filled 2019-01-12: qty 10

## 2019-01-12 MED ORDER — SENNOSIDES-DOCUSATE SODIUM 8.6-50 MG PO TABS
1.0000 | ORAL_TABLET | Freq: Two times a day (BID) | ORAL | Status: DC
  Administered 2019-01-12 – 2019-01-14 (×4): 1 via ORAL
  Filled 2019-01-12 (×4): qty 1

## 2019-01-12 MED ORDER — CIPROFLOXACIN IN D5W 400 MG/200ML IV SOLN: 400.0000 mg | Freq: Two times a day (BID) | INTRAVENOUS | Status: DC

## 2019-01-12 MED ORDER — CIPROFLOXACIN IN D5W 400 MG/200ML IV SOLN
400.0000 mg | INTRAVENOUS | Status: DC
  Administered 2019-01-12 – 2019-01-13 (×2): 400 mg via INTRAVENOUS
  Filled 2019-01-12 (×2): qty 200

## 2019-01-12 MED ORDER — SORBITOL 70 % SOLN
30.0000 mL | Freq: Once | Status: AC
  Administered 2019-01-12: 30 mL via ORAL
  Filled 2019-01-12: qty 30

## 2019-01-12 NOTE — Plan of Care (Signed)
°  Problem: Education: °Goal: Knowledge of General Education information will improve °Description: Including pain rating scale, medication(s)/side effects and non-pharmacologic comfort measures °Outcome: Progressing °  °Problem: Health Behavior/Discharge Planning: °Goal: Ability to manage health-related needs will improve °Outcome: Progressing °  °Problem: Clinical Measurements: °Goal: Ability to maintain clinical measurements within normal limits will improve °Outcome: Progressing °  °Problem: Clinical Measurements: °Goal: Respiratory complications will improve °Outcome: Progressing °  °Problem: Activity: °Goal: Risk for activity intolerance will decrease °Outcome: Progressing °  °

## 2019-01-12 NOTE — Progress Notes (Addendum)
PROGRESS NOTE    Saban Pegg  [id number] DOB: 1927/09/20 DOA: 01/08/2019 PCP: Hennie Duos, MD    Brief Narrative:  HPI per Dr. Serita Sheller is a 85 y.o. male with medical history significant for stage 5 chronic kidney disease, not on dialysis yet followed by nephrology yet [place], COPD, anemia of chronic disease, neurogenic bladder with urinary retention requiring self-catheterization, was just discharged from [facility] yesterday after admission for hypotension, acute kidney injury and hyperkalemia.  Patient's baseline creatinine is around 3, he was hospitalized a week ago with a creatinine of 5.3, potassium of 6 and hypotension, he was treated with IV fluids, sodium bicarbonate, followed by nephrology, renal ultrasound did not show hydronephrosis, he was also found to have Pseudomonas in his urine culture which was felt to be a contaminant by infectious disease, ultimately his creatinine improved to 3.0 and he was discharged home yesterday early afternoon. -Patient reports feeling okay at discharge he lives with his wife, he went to bed last night, woke up this morning and felt faint, walked to the bathroom and remembers sitting down on his commode, does not recall the events after this, subsequently found down on the bathroom floor by family unresponsive, EMS was called patient was found to be tachycardic with a thready pulse and decreased respiration, subsequently regained his consciousness and was brought to ED. there was reported incontinence of stool on the bathroom floor. -Patient denies any complaints at this time, he denies any fevers or chills, no dyspnea chest pain or shortness of breath no palpitations no nausea vomiting or diarrhea  ED Course: Blood pressure was 103/58 on arrival, rest of vital signs were stable, labs noted stage IV-V kidney disease otherwise unremarkable labs   Assessment & Plan:   Principal Problem:   Syncope and collapse  Active Problems:   Chronic kidney disease, stage V (HCC)   Essential hypertension   Chronic obstructive pulmonary disease (COPD) (HCC)   Syncope   Acute lower UTI  1 syncope/dizziness Patient presented with a syncopal episode and noted on arrival to have a blood pressure of 103/58.  Patient with a history of stage IV stage V chronic kidney disease.  Patient does endorse feeling lightheaded and dizzy prior to syncopal episode.  Patient also stating that from supine to sitting position has had some bouts of dizziness.  Patient denies any tongue biting no jerking motions of extremities prior to admission.  Patient does note he had some bowel incontinence.  Orthostatics were ordered on admission however not done. Patient with no signs or symptoms of infection.  Urinalysis with large leukocytes, greater than 50 WBC.  Urine cultures with > 100,000 GNR.  Patient still with dizziness as of yesterday morning.  2D echo with EF of greater than 65%, moderately increased left ventricular wall thickness, no stenosis of aortic valve.  No wall motion abnormalities. Head CT which was done was unremarkable.  MRI head was negative for any acute abnormalities.  Patient still noted to be orthostatic.  We will continue to hold Flomax and likely will not resume on discharge due to concerns for orthostatic hypotension.  Patient afebrile.  Will start patient on IV Rocephin due to greater than 100,000 colonies of gram-negative rod. Due to ongoing dizziness, neurology was consulted who feel no further work-up is needed at this time and to continue supportive care.  Meclizine as needed. IV fluids have been saline locked due to chronic kidney disease stage V and concerns for volume  overload.  Follow.   2.  Orthostatic hypotension. Patient noted to be orthostatic.  Patient had presented with syncope and some complaints of dizziness.  Patient with dizziness while laying flat. Orthostatic vital signs are positive and seem to have  improved.  Patient however currently looks euvolemic on examination.  Patient with chronic kidney disease stage V and due to concerns for volume overload.  IV fluids have been saline locked. Continue TED hose.  Follow.  3.  Chronic kidney disease stage V Stable.  Continue calcitriol and bicarb tablets.  Currently euvolemic.  Follow.  4.  COPD Stable.  5.  Neurogenic bladder with chronic retention Patient requiring I and O catheterizations intermittently at baseline.  Foley catheter ordered and likely continue during the hospitalization and discontinue on discharge.  Continue to hold Flomax as it is felt this could exacerbate orthostatic hypotension.  Would likely NOT resume Flomax on discharge.  6.  Anemia Likely dilutional.  Patient denies any overt bleeding.  Anemia panel consistent with anemia of chronic disease.  H&H stable at 9.6. Transfusion threshold hemoglobin less than 7.  7.  UTI. Urinalysis positive leukocytes, nitrite negative, WBC greater than 50.  Urine cultures with greater than 100,000 colonies of gram-negative rods.  Due to patient's dizziness will treat empirically with IV Rocephin pending sensitivities.  Follow.    DVT prophylaxis: Heparin Code Status: Full Family Communication: Updated patient.  No family at bedside. Disposition Plan: To be determined.   Consultants:   Neurology: Dr. Leonel Ramsay 01/11/2019  Procedures:   2D echo 01/10/2019  CT head CT C-spine 01/08/2019  Chest x-ray 01/08/2019  MRI head 01/10/2019  Antimicrobials: IV Rocephin 01/12/2019   Subjective: Patient lying in bed.  Patient easily arousable.  Patient states last episode of dizziness was yesterday morning.  Denies any chest pain.  No shortness of breath.  Denies any dysuria however does have a Foley catheter in place.   Objective: Vitals:   01/11/19 0429 01/11/19 0819 01/11/19 2000 01/12/19 0528  BP: (!) 161/82 (!) 161/82 (!) 161/80 (!) 143/79  Pulse: 82 82 86 90  Resp: 18 18 16 18    Temp: (!) 97.5 F (36.4 C) (!) 97.5 F (36.4 C) 98 F (36.7 C) 98.4 F (36.9 C)  TempSrc: Oral Oral Oral Oral  SpO2: 98%  96% 97%  Weight: 72 kg 72 kg  71 kg  Height:  5\' 11"  (1.803 m)      Intake/Output Summary (Last 24 hours) at 01/12/2019 1036 Last data filed at 01/12/2019 0531 Gross per 24 hour  Intake 540 ml  Output 1525 ml  Net -985 ml   Filed Weights   01/11/19 0429 01/11/19 0819 01/12/19 0528  Weight: 72 kg 72 kg 71 kg    Examination:  General exam: NAD  Respiratory system: Lungs clear to auscultation bilaterally anterior lung fields.  No wheezes, no crackles, no rhonchi.  Cardiovascular system: RRR no murmurs rubs or gallops.  No JVD.  No lower extremity edema.  Gastrointestinal system: Abdomen is soft, nontender, nondistended, positive bowel sounds.  No rebound.  No guarding.   Central nervous system: Alert and oriented.  Right lower extremity weakness chronic per patient.  Extremities: Symmetric 5 x 5 power. Skin: No rashes, lesions or ulcers Psychiatry: Judgement and insight appear normal. Mood & affect appropriate.     Data Reviewed: I have personally reviewed following labs and imaging studies  CBC: Recent Labs  Lab 01/08/19 1258 01/09/19 0438 01/10/19 0511 01/11/19 0230 01/12/19 0512  WBC  5.4 4.5 4.4 5.3 4.5  NEUTROABS 4.1  --   --   --   --   HGB 11.1* 9.3* 9.1* 9.6* 9.6*  HCT 36.9* 30.0* 28.9* 31.0* 31.3*  MCV 97.1 94.3 93.5 94.8 94.8  PLT 188 175 147* 184 [id number]   Basic Metabolic Panel: Recent Labs  Lab 01/08/19 1258 01/09/19 0438 01/10/19 0511 01/11/19 0230 01/12/19 0512  NA 138 137 137 139 138  K 5.1 4.9 4.5 4.9 4.9  CL 108 107 108 108 106  CO2 20* 19* 19* 21* 21*  GLUCOSE 104* 77 97 96 94  BUN 46* 51* 47* 48* 48*  CREATININE 3.49* 3.38* 3.16* 3.08* 3.11*  CALCIUM 10.1 9.2 9.3 9.8 9.7  MG 2.0  --   --   --   --   PHOS  --   --  2.8 3.1  --    GFR: Estimated Creatinine Clearance: 16.5 mL/min (A) (by C-G formula based on SCr of 3.11  mg/dL (H)). Liver Function Tests: Recent Labs  Lab 01/08/19 1258 01/09/19 0438 01/10/19 0511 01/11/19 0230  AST 20 17  --   --   ALT 10 10  --   --   ALKPHOS 46 39  --   --   BILITOT 0.7 0.6  --   --   PROT 8.1 6.4*  --   --   ALBUMIN 3.6 2.9* 2.8* 2.9*   No results for input(s): LIPASE, AMYLASE in the last 168 hours. No results for input(s): AMMONIA in the last 168 hours. Coagulation Profile: No results for input(s): INR, PROTIME in the last 168 hours. Cardiac Enzymes: No results for input(s): CKTOTAL, CKMB, CKMBINDEX, TROPONINI in the last 168 hours. BNP (last 3 results) No results for input(s): PROBNP in the last 8760 hours. HbA1C: No results for input(s): HGBA1C in the last 72 hours. CBG: Recent Labs  Lab 01/08/19 1247  GLUCAP 101*   Lipid Profile: No results for input(s): CHOL, HDL, LDLCALC, TRIG, CHOLHDL, LDLDIRECT in the last 72 hours. Thyroid Function Tests: No results for input(s): TSH, T4TOTAL, FREET4, T3FREE, THYROIDAB in the last 72 hours. Anemia Panel: Recent Labs    01/10/19 0511  VITAMINB12 477  FOLATE 8.3  FERRITIN 494*  TIBC 185*  IRON 47   Sepsis Labs: No results for input(s): PROCALCITON, LATICACIDVEN in the last 168 hours.  Recent Results (from the past 240 hour(s))  SARS CORONAVIRUS 2 Nasal Swab Aptima Multi Swab     Status: None   Collection Time: 01/09/19 12:25 PM   Specimen: Aptima Multi Swab; Nasal Swab  Result Value Ref Range Status   SARS Coronavirus 2 NEGATIVE NEGATIVE Final    Comment: (NOTE) SARS-CoV-2 target nucleic acids are NOT DETECTED. The SARS-CoV-2 RNA is generally detectable in upper and lower respiratory specimens during the acute phase of infection. Negative results do not preclude SARS-CoV-2 infection, do not rule out co-infections with other pathogens, and should not be used as the sole basis for treatment or other patient management decisions. Negative results must be combined with clinical observations, patient  history, and epidemiological information. The expected result is Negative. Fact Sheet for Patients: SugarRoll.be Fact Sheet for Healthcare Providers: https://www.woods-mathews.com/ This test is not yet approved or cleared by the [place] FDA and  has been authorized for detection and/or diagnosis of SARS-CoV-2 by FDA under an Emergency Use Authorization (EUA). This EUA will remain  in effect (meaning this test can be used) for the duration of the COVID-19 declaration under Section  56 4(b)(1) of the Act, 21 U.S.C. section 360bbb-3(b)(1), unless the authorization is terminated or revoked sooner. Performed at [facility], [place] [address], [place], [place] [address]   Culture, Urine     Status: Abnormal (Preliminary result)   Collection Time: 01/11/19 12:47 AM   Specimen: Urine, Catheterized  Result Value Ref Range Status   Specimen Description URINE, CATHETERIZED  Final   Special Requests   Final    NONE Performed at [facility], [place] [address], [place], [place] [address]    Culture >=100,000 COLONIES/mL GRAM NEGATIVE RODS (A)  Final   Report Status PENDING  Incomplete         Radiology Studies: Mr Brain Wo Contrast  Result Date: 01/10/2019 CLINICAL DATA:  Persistent vertigo. EXAM: MRI HEAD WITHOUT CONTRAST TECHNIQUE: Multiplanar, multiecho pulse sequences of the brain and surrounding structures were obtained without intravenous contrast. COMPARISON:  Head CT 01/08/2019 FINDINGS: Brain: Diffusion imaging does not show any acute or subacute infarction. There chronic small-vessel ischemic changes of the pons. Few old small vessel cerebellar infarctions. 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 Extensive chronic small-vessel ischemic changes throughout the deep and subcortical white matter. Multiple dilated perivascular spaces affecting the cerebral hemispheres. No large vessel territory infarction. No mass lesion, acute hemorrhage,  hydrocephalus or extra-axial collection. Punctate foci of hemosiderin associated with many of the old small vessel infarctions. Vascular: Major vessels at the base of the brain show flow. Skull and upper cervical spine: Negative Sinuses/Orbits: Clear/normal. Other: No fluid in the mastoid air cells. IMPRESSION: No acute or reversible finding. No specific abnormality seen to explain vertigo. Extensive chronic small-vessel ischemic changes affecting the brainstem, right thalamus and cerebral hemispheric white matter. Electronically Signed   By: Nelson Chimes M.D.   On: 01/10/2019 14:58        Scheduled Meds: . calcitRIOL  0.5 mcg Oral Daily  . febuxostat  20 mg Oral Daily  . heparin  5,000 Units Subcutaneous Q8H  . sodium bicarbonate  1,300 mg Oral TID   Continuous Infusions: . cefTRIAXone (ROCEPHIN)  IV 1 g (01/12/19 1033)  . sodium chloride Stopped (01/11/19 2315)     LOS: 1 day    Time spent: 35 minutes    Irine Seal, MD [facility]  If 7PM-7AM, please contact night-coverage [email or web address] 01/12/2019, 10:36 AM

## 2019-01-13 DIAGNOSIS — B965 Pseudomonas (aeruginosa) (mallei) (pseudomallei) as the cause of diseases classified elsewhere: Secondary | ICD-10-CM

## 2019-01-13 DIAGNOSIS — N3080 Other cystitis without hematuria: Secondary | ICD-10-CM

## 2019-01-13 LAB — BASIC METABOLIC PANEL
Anion gap: 9 (ref 5–15)
BUN: 58 mg/dL — ABNORMAL HIGH (ref 8–23)
CO2: 22 mmol/L (ref 22–32)
Calcium: 9.7 mg/dL (ref 8.9–10.3)
Chloride: 107 mmol/L (ref 98–111)
Creatinine, Ser: 3.66 mg/dL — ABNORMAL HIGH (ref 0.61–1.24)
GFR calc Af Amer: 16 mL/min — ABNORMAL LOW (ref >60)
GFR calc non Af Amer: 14 mL/min — ABNORMAL LOW (ref >60)
Glucose, Bld: 88 mg/dL (ref 70–99)
Potassium: 5 mmol/L (ref 3.5–5.1)
Sodium: 138 mmol/L (ref 135–145)

## 2019-01-13 LAB — URINE CULTURE: Culture: >=100000 — AB

## 2019-01-13 LAB — HEMOGLOBIN AND HEMATOCRIT, BLOOD
HCT: 30.9 % — ABNORMAL LOW (ref 39.0–52.0)
Hemoglobin: 9.5 g/dL — ABNORMAL LOW (ref 13.0–17.0)

## 2019-01-13 MED ORDER — SODIUM CHLORIDE 0.9 % IV SOLN
INTRAVENOUS | Status: DC | PRN
  Administered 2019-01-13: 1000 mL via INTRAVENOUS

## 2019-01-13 NOTE — Plan of Care (Signed)
  Problem: Education: Goal: Knowledge of General Education information will improve Description Including pain rating scale, medication(s)/side effects and non-pharmacologic comfort measures Outcome: Progressing   Problem: Activity: Goal: Risk for activity intolerance will decrease Outcome: Progressing   Problem: Safety: Goal: Ability to remain free from injury will improve Outcome: Progressing   

## 2019-01-13 NOTE — Progress Notes (Signed)
PROGRESS NOTE    Ray Howard  [id number] DOB: 11-25-1927 DOA: 01/08/2019 PCP: Hennie Duos, MD    Brief Narrative:  HPI per Dr. Serita Sheller is a 85 y.o. male with medical history significant for stage 5 chronic kidney disease, not on dialysis yet followed by nephrology yet [place], COPD, anemia of chronic disease, neurogenic bladder with urinary retention requiring self-catheterization, was just discharged from [facility] yesterday after admission for hypotension, acute kidney injury and hyperkalemia.  Patient's baseline creatinine is around 3, he was hospitalized a week ago with a creatinine of 5.3, potassium of 6 and hypotension, he was treated with IV fluids, sodium bicarbonate, followed by nephrology, renal ultrasound did not show hydronephrosis, he was also found to have Pseudomonas in his urine culture which was felt to be a contaminant by infectious disease, ultimately his creatinine improved to 3.0 and he was discharged home yesterday early afternoon. -Patient reports feeling okay at discharge he lives with his wife, he went to bed last night, woke up this morning and felt faint, walked to the bathroom and remembers sitting down on his commode, does not recall the events after this, subsequently found down on the bathroom floor by family unresponsive, EMS was called patient was found to be tachycardic with a thready pulse and decreased respiration, subsequently regained his consciousness and was brought to ED. there was reported incontinence of stool on the bathroom floor. -Patient denies any complaints at this time, he denies any fevers or chills, no dyspnea chest pain or shortness of breath no palpitations no nausea vomiting or diarrhea  ED Course: Blood pressure was 103/58 on arrival, rest of vital signs were stable, labs noted stage IV-V kidney disease otherwise unremarkable labs   Assessment & Plan:   Principal Problem:   Syncope and  collapse Active Problems:   Chronic kidney disease, stage V (HCC)   Essential hypertension   Chronic obstructive pulmonary disease (COPD) (HCC)   Syncope   Acute lower UTI  1 syncope/dizziness Patient presented with a syncopal episode and noted on arrival to have a blood pressure of 103/58.  Patient with a history of stage IV stage V chronic kidney disease.  Patient does endorse feeling lightheaded and dizzy prior to syncopal episode.  Patient also stating that from supine to sitting position has had some bouts of dizziness.  Patient denies any tongue biting no jerking motions of extremities prior to admission.  Patient does note he had some bowel incontinence.  Orthostatics were ordered on admission however not done. Patient with no signs or symptoms of infection.  Urinalysis with large leukocytes, greater than 50 WBC.  Urine cultures with > 100,000 GNR.  Patient still with dizziness as of yesterday morning.  2D echo with EF of greater than 65%, moderately increased left ventricular wall thickness, no stenosis of aortic valve.  No wall motion abnormalities. Head CT which was done was unremarkable.  MRI head was negative for any acute abnormalities.  Patient still noted to be orthostatic.  We will continue to hold Flomax and likely will not resume on discharge due to concerns for orthostatic hypotension.  Patient afebrile.  Urine cultures with greater than 100,000 colonies of Pseudomonas.  Due to patient's presentation with dizziness and syncopal episode will treat empirically with IV antibiotics.  Patient initially placed on IV Rocephin and was changed to IV ciprofloxacin.  Would likely transition to oral ciprofloxacin tomorrow to complete a 5 to 7-day course of antibiotic treatment. Due to  ongoing dizziness, neurology was consulted who feel no further work-up is needed at this time and to continue supportive care.  Meclizine as needed. IV fluids have been saline locked due to chronic kidney disease stage  V and concerns for volume overload.  Follow.   2.  Orthostatic hypotension. Patient noted to be orthostatic.  Patient had presented with syncope and some complaints of dizziness.  Patient with dizziness while laying flat. Orthostatic vital signs are positive and seem to have improved.  Patient however currently looks euvolemic on examination.  Patient with chronic kidney disease stage V and due to concerns for volume overload.  IV fluids have been saline locked. Continue TED hose.  Follow.  3.  Chronic kidney disease stage V Stable.  Continue calcitriol and bicarb tablets.  Currently euvolemic.  Follow.  4.  COPD Stable.  5.  Neurogenic bladder with chronic retention Patient requiring I and O catheterizations intermittently at baseline.  Foley catheter ordered and likely continue during the hospitalization and discontinue on discharge.  Continue to hold Flomax as it is felt this could exacerbate orthostatic hypotension.  Would likely NOT resume Flomax on discharge.  6.  Anemia Likely dilutional.  Patient denies any overt bleeding.  Anemia panel consistent with anemia of chronic disease.  H&H stable at 9.5. Transfusion threshold hemoglobin less than 7.  7.  Pseudomonas UTI. Urinalysis positive leukocytes, nitrite negative, WBC greater than 50.  Urine cultures with greater than 100,000 colonies of pseudomonas aeruginosa.  Patient initially placed on IV Rocephin has been transitioned to IV ciprofloxacin.  Will likely transition to oral ciprofloxacin tomorrow if tolerated and continued clinical improvement.    DVT prophylaxis: Heparin Code Status: Full Family Communication: Updated patient.  No family at bedside. Disposition Plan: Home with home health hopefully tomorrow.    Consultants:   Neurology: Dr. Leonel Ramsay 01/11/2019  Procedures:   2D echo 01/10/2019  CT head CT C-spine 01/08/2019  Chest x-ray 01/08/2019  MRI head 01/10/2019  Antimicrobials: IV Rocephin 01/12/2019>>>> 01/12/2019 IV  ciprofloxacin 01/12/2019   Subjective: Patient in bed.  Patient stated ambulated with physical therapy this morning in the hallway with some complaints of headache.  Patient also stated had some blurry vision which has since improved.  Patient denies any dizziness this morning.  Denies any chest pain.   Objective: Vitals:   01/12/19 2037 01/13/19 0339 01/13/19 0553 01/13/19 0946  BP: 126/77  119/73 128/72  Pulse: 82  85 86  Resp: 20  20 18   Temp: 98.1 F (36.7 C)  98.1 F (36.7 C) 97.7 F (36.5 C)  TempSrc: Oral  Oral Oral  SpO2: 97%  98% 99%  Weight:  71 kg    Height:        Intake/Output Summary (Last 24 hours) at 01/13/2019 1038 Last data filed at 01/13/2019 0818 Gross per 24 hour  Intake 580 ml  Output 1401 ml  Net -821 ml   Filed Weights   01/11/19 0819 01/12/19 0528 01/13/19 0339  Weight: 72 kg 71 kg 71 kg    Examination:  General exam: NAD  Respiratory system: CTA B.  No wheezes, no crackles, no rhonchi.  Normal respiratory effort.  Cardiovascular system: Regular rate rhythm no murmurs rubs or gallops.  No JVD.  No lower extremity edema. Gastrointestinal system: Abdomen is nontender, nondistended, soft, positive bowel sounds.  No rebound.  No guarding.  Central nervous system: Alert and oriented.  Right lower extremity weakness chronic per patient.  Extremities: Symmetric 5 x 5  power. Skin: No rashes, lesions or ulcers Psychiatry: Judgement and insight appear normal. Mood & affect appropriate.     Data Reviewed: I have personally reviewed following labs and imaging studies  CBC: Recent Labs  Lab 01/08/19 1258 01/09/19 0438 01/10/19 0511 01/11/19 0230 01/12/19 0512 01/13/19 0606  WBC 5.4 4.5 4.4 5.3 4.5  --   NEUTROABS 4.1  --   --   --   --   --   HGB 11.1* 9.3* 9.1* 9.6* 9.6* 9.5*  HCT 36.9* 30.0* 28.9* 31.0* 31.3* 30.9*  MCV 97.1 94.3 93.5 94.8 94.8  --   PLT 188 175 147* 184 171  --    Basic Metabolic Panel: Recent Labs  Lab 01/08/19 1258  01/09/19 0438 01/10/19 0511 01/11/19 0230 01/12/19 0512 01/13/19 0606  NA 138 137 137 139 138 138  K 5.1 4.9 4.5 4.9 4.9 5.0  CL 108 107 108 108 106 107  CO2 20* 19* 19* 21* 21* 22  GLUCOSE 104* 77 97 96 94 88  BUN 46* 51* 47* 48* 48* 58*  CREATININE 3.49* 3.38* 3.16* 3.08* 3.11* 3.66*  CALCIUM 10.1 9.2 9.3 9.8 9.7 9.7  MG 2.0  --   --   --   --   --   PHOS  --   --  2.8 3.1  --   --    GFR: Estimated Creatinine Clearance: 14 mL/min (A) (by C-G formula based on SCr of 3.66 mg/dL (H)). Liver Function Tests: Recent Labs  Lab 01/08/19 1258 01/09/19 0438 01/10/19 0511 01/11/19 0230  AST 20 17  --   --   ALT 10 10  --   --   ALKPHOS 46 39  --   --   BILITOT 0.7 0.6  --   --   PROT 8.1 6.4*  --   --   ALBUMIN 3.6 2.9* 2.8* 2.9*   No results for input(s): LIPASE, AMYLASE in the last 168 hours. No results for input(s): AMMONIA in the last 168 hours. Coagulation Profile: No results for input(s): INR, PROTIME in the last 168 hours. Cardiac Enzymes: No results for input(s): CKTOTAL, CKMB, CKMBINDEX, TROPONINI in the last 168 hours. BNP (last 3 results) No results for input(s): PROBNP in the last 8760 hours. HbA1C: No results for input(s): HGBA1C in the last 72 hours. CBG: Recent Labs  Lab 01/08/19 1247  GLUCAP 101*   Lipid Profile: No results for input(s): CHOL, HDL, LDLCALC, TRIG, CHOLHDL, LDLDIRECT in the last 72 hours. Thyroid Function Tests: No results for input(s): TSH, T4TOTAL, FREET4, T3FREE, THYROIDAB in the last 72 hours. Anemia Panel: No results for input(s): VITAMINB12, FOLATE, FERRITIN, TIBC, IRON, RETICCTPCT in the last 72 hours. Sepsis Labs: No results for input(s): PROCALCITON, LATICACIDVEN in the last 168 hours.  Recent Results (from the past 240 hour(s))  SARS CORONAVIRUS 2 Nasal Swab Aptima Multi Swab     Status: None   Collection Time: 01/09/19 12:25 PM   Specimen: Aptima Multi Swab; Nasal Swab  Result Value Ref Range Status   SARS Coronavirus 2  NEGATIVE NEGATIVE Final    Comment: (NOTE) SARS-CoV-2 target nucleic acids are NOT DETECTED. The SARS-CoV-2 RNA is generally detectable in upper and lower respiratory specimens during the acute phase of infection. Negative results do not preclude SARS-CoV-2 infection, do not rule out co-infections with other pathogens, and should not be used as the sole basis for treatment or other patient management decisions. Negative results must be combined with clinical observations, patient history,  and epidemiological information. The expected result is Negative. Fact Sheet for Patients: SugarRoll.be Fact Sheet for Healthcare Providers: https://www.woods-mathews.com/ This test is not yet approved or cleared by the [place] FDA and  has been authorized for detection and/or diagnosis of SARS-CoV-2 by FDA under an Emergency Use Authorization (EUA). This EUA will remain  in effect (meaning this test can be used) for the duration of the COVID-19 declaration under Section 56 4(b)(1) of the Act, 21 U.S.C. section 360bbb-3(b)(1), unless the authorization is terminated or revoked sooner. Performed at [facility], [place] [address], [place], [place] [address]   Culture, Urine     Status: Abnormal   Collection Time: 01/11/19 12:47 AM   Specimen: Urine, Catheterized  Result Value Ref Range Status   Specimen Description URINE, CATHETERIZED  Final   Special Requests   Final    NONE Performed at [facility], [place] [address], [place], [place] [address]    Culture >=100,000 COLONIES/mL PSEUDOMONAS AERUGINOSA (A)  Final   Report Status 01/13/2019 FINAL  Final   Organism ID, Bacteria PSEUDOMONAS AERUGINOSA (A)  Final      Susceptibility   Pseudomonas aeruginosa - MIC*    CEFTAZIDIME 4 SENSITIVE Sensitive     CIPROFLOXACIN <=0.25 SENSITIVE Sensitive     GENTAMICIN <=1 SENSITIVE Sensitive     IMIPENEM <=0.25 SENSITIVE Sensitive     PIP/TAZO 64  SENSITIVE Sensitive     CEFEPIME 4 SENSITIVE Sensitive     * >=100,000 COLONIES/mL PSEUDOMONAS AERUGINOSA         Radiology Studies: No results found.      Scheduled Meds:  calcitRIOL  0.5 mcg Oral Daily   febuxostat  20 mg Oral Daily   heparin  5,000 Units Subcutaneous Q8H   senna-docusate  1 tablet Oral BID   sodium bicarbonate  1,300 mg Oral TID   Continuous Infusions:  ciprofloxacin 400 mg (01/12/19 2201)   sodium chloride Stopped (01/11/19 2315)     LOS: 2 days    Time spent: 35 minutes    Irine Seal, MD [facility]  If 7PM-7AM, please contact night-coverage [email or web address] 01/13/2019, 10:38 AM

## 2019-01-13 NOTE — Progress Notes (Signed)
Occupational Therapy Treatment Patient Details Name: Ray Howard MRN: [id number] DOB: [birth year] Today's Date: 01/13/2019    History of present illness 85 yo male admitted after syncope in bathroom with D/C 1 day prior to admission from [facility] after AKI with hypotension. PMhx: neurogenic bladder with retention, CKD, COPD, anemia, gout   OT comments  Patient seated in recliner upon entry, agreeable to OT session.  Max assist required to ascend from recliner, increased time and cueing for techniques. Reports using lift chair at home, due to limited flexion. Once standing, able to complete in room mobility with min guard using RW.  Stood at sink and engaged in self care, min guard for grooming and total assist for hygiene care.  Returned to supine with mod assist at completion of session. Will follow acutely.    Follow Up Recommendations  Home health OT;Supervision/Assistance - 24 hour    Equipment Recommendations  None recommended by OT    Recommendations for Other Services PT consult    Precautions / Restrictions Precautions Precautions: Fall Precaution Comments: bil knee flexion and hip flexion limited ROM Restrictions Weight Bearing Restrictions: No       Mobility Bed Mobility Overal bed mobility: Needs Assistance Bed Mobility: Sit to Supine     Supine to sit: Mod assist Sit to supine: Mod assist   General bed mobility comments: requires assist to bring B LEs into bed, and control trunk for safety   Transfers Overall transfer level: Needs assistance Equipment used: Rolling walker (2 wheeled) Transfers: Sit to/from Stand Sit to Stand: Max assist         General transfer comment: pt seated in recliner upon entry, max assist required to ascend due to limited flexion/rigidity given increased time required    Balance Overall balance assessment: Needs assistance Sitting-balance support: Feet supported;Bilateral upper extremity supported Sitting balance-Leahy  Scale: Poor Sitting balance - Comments: at least min guard assist Postural control: Posterior lean Standing balance support: Bilateral upper extremity supported;During functional activity;Single extremity supported Standing balance-Leahy Scale: Poor Standing balance comment: pt able to stand at sink with min guard with 1-2 hand support, preference to 2 UE support                           ADL either performed or assessed with clinical judgement   ADL Overall ADL's : Needs assistance/impaired     Grooming: Min guard;Standing;Wash/dry hands;Wash/dry face;Oral care                   Toilet Transfer: Maximal assistance;Ambulation;RW Toilet Transfer Details (indicate cue type and reason): max assist simulated from recliner          Functional mobility during ADLs: Min guard;Rolling walker       Vision       Perception     Praxis      Cognition Arousal/Alertness: Awake/alert Behavior During Therapy: Flat affect Overall Cognitive Status: Within Functional Limits for tasks assessed                                          Exercises     Shoulder Instructions       General Comments reports inital dizziness with standing     Pertinent Vitals/ Pain       Pain Assessment: Faces Faces Pain Scale: No hurt Pain Location: pain above left  eye Pain Intervention(s): (notified RN)  Home Living                                          Prior Functioning/Environment              Frequency  Min 2X/week        Progress Toward Goals  OT Goals(current goals can now be found in the care plan section)  Progress towards OT goals: Progressing toward goals  Acute Rehab OT Goals Patient Stated Goal: return home and watch Tv OT Goal Formulation: With patient Time For Goal Achievement: 01/25/19 Potential to Achieve Goals: Good  Plan Discharge plan remains appropriate;Frequency remains appropriate     Co-evaluation                 AM-PAC OT "6 Clicks" Daily Activity     Outcome Measure   Help from another person eating meals?: None Help from another person taking care of personal grooming?: A Little Help from another person toileting, which includes using toliet, bedpan, or urinal?: A Little Help from another person bathing (including washing, rinsing, drying)?: A Lot Help from another person to put on and taking off regular upper body clothing?: A Little Help from another person to put on and taking off regular lower body clothing?: A Lot 6 Click Score: 17    End of Session Equipment Utilized During Treatment: Gait belt;Rolling walker  OT Visit Diagnosis: Unsteadiness on feet (R26.81);Other abnormalities of gait and mobility (R26.89);Muscle weakness (generalized) (M62.81)   Activity Tolerance Patient tolerated treatment well   Patient Left in bed;with call bell/phone within reach;with bed alarm set;with nursing/sitter in room   Nurse Communication Mobility status        Time: [id number] OT Time Calculation (min): 25 min  Charges: OT General Charges $OT Visit: 1 Visit OT Treatments $Self Care/Home Management : 23-37 mins  Delight Stare, [place] Pager [phone] Office [phone]    Delight Stare 01/13/2019, 1:05 PM

## 2019-01-13 NOTE — Progress Notes (Signed)
Physical Therapy Treatment Patient Details Name: Ray Howard MRN: [id number] DOB: [birth year] Today's Date: 01/13/2019    History of Present Illness 85 yo male admitted after syncope in bathroom with D/C 1 day prior to admission from [facility] after AKI with hypotension. PMhx: neurogenic bladder with retention, CKD, COPD, anemia, gout    PT Comments    Patient seen for mobility progression. Pt requires min A to stand from elevated bed height and max A to stand from recliner due to limited ROM and rigid movements. Pt tolerated gait distance of 40 ft total with c/o dizziness when returning back to room that subsided after sitting. Pt will benefit from further skilled PT services to maximize independence and safety with mobility and will need 24 hour assistance. Continue to progress as tolerated.   Follow Up Recommendations  Home health PT;Supervision/Assistance - 24 hour     Equipment Recommendations  None recommended by PT    Recommendations for Other Services       Precautions / Restrictions Precautions Precautions: Fall Precaution Comments: bil knee flexion and hip flexion limited ROM Restrictions Weight Bearing Restrictions: No    Mobility  Bed Mobility Overal bed mobility: Needs Assistance Bed Mobility: Supine to Sit     Supine to sit: Mod assist     General bed mobility comments: assistance required to bring hips to EOB and elevate trunk into sitting; use of rail; pt with limited trunk flexion and posterior bias  Transfers Overall transfer level: Needs assistance Equipment used: Rolling walker (2 wheeled) Transfers: Sit to/from Stand Sit to Stand: From elevated surface;Min assist;Max assist         General transfer comment: min A from elevated bed height and max A to stand from recliner due to limited flexion/rigidity; feet blocked for safety; assistance to power up into standing and increased time for transition of hand placement from chair to  RW  Ambulation/Gait Ambulation/Gait assistance: Min assist;Min guard Gait Distance (Feet): 40 Feet Assistive device: Rolling walker (2 wheeled) Gait Pattern/deviations: Step-through pattern;Decreased stride length;Trunk flexed;Decreased step length - right Gait velocity: decreased   General Gait Details: grossly min guard for safety and min A to steady when turning and stepping backwards to sit   Chief Strategy Officer    Modified Rankin (Stroke Patients Only)       Balance Overall balance assessment: Needs assistance Sitting-balance support: Feet supported;Bilateral upper extremity supported Sitting balance-Leahy Scale: Poor Sitting balance - Comments: worked on leaning forward; posterior lean. Increased time to gain balance at EOB and then able to sit with Min Guard A Postural control: Posterior lean Standing balance support: Bilateral upper extremity supported Standing balance-Leahy Scale: Poor Standing balance comment: Reliant on UE support                            Cognition Arousal/Alertness: Awake/alert Behavior During Therapy: Flat affect Overall Cognitive Status: Within Functional Limits for tasks assessed                                        Exercises      General Comments        Pertinent Vitals/Pain Pain Assessment: Faces Faces Pain Scale: Hurts little more Pain Location: pain above left eye Pain Intervention(s): (notified RN)    Home Living  Prior Function            PT Goals (current goals can now be found in the care plan section) Progress towards PT goals: Progressing toward goals    Frequency    Min 3X/week      PT Plan Current plan remains appropriate    Co-evaluation              AM-PAC PT "6 Clicks" Mobility   Outcome Measure  Help needed turning from your back to your side while in a flat bed without using bedrails?: A Little Help  needed moving from lying on your back to sitting on the side of a flat bed without using bedrails?: A Lot Help needed moving to and from a bed to a chair (including a wheelchair)?: A Lot Help needed standing up from a chair using your arms (e.g., wheelchair or bedside chair)?: A Lot Help needed to walk in hospital room?: A Little Help needed climbing 3-5 steps with a railing? : A Lot 6 Click Score: 14    End of Session Equipment Utilized During Treatment: Gait belt Activity Tolerance: Patient tolerated treatment well Patient left: with call bell/phone within reach;in chair;with chair alarm set Nurse Communication: Mobility status PT Visit Diagnosis: Other abnormalities of gait and mobility (R26.89);Muscle weakness (generalized) (M62.81);Unsteadiness on feet (R26.81)     Time: [id number] PT Time Calculation (min) (ACUTE ONLY): 34 min  Charges:  $Gait Training: 23-37 mins                     Earney Navy, PTA Acute Rehabilitation Services Pager: [phone] Office: [phone]     Darliss Cheney 01/13/2019, 11:22 AM

## 2019-01-14 DIAGNOSIS — B965 Pseudomonas (aeruginosa) (mallei) (pseudomallei) as the cause of diseases classified elsewhere: Secondary | ICD-10-CM

## 2019-01-14 LAB — BASIC METABOLIC PANEL
Anion gap: 12 (ref 5–15)
BUN: 62 mg/dL — ABNORMAL HIGH (ref 8–23)
CO2: 20 mmol/L — ABNORMAL LOW (ref 22–32)
Calcium: 9.6 mg/dL (ref 8.9–10.3)
Chloride: 103 mmol/L (ref 98–111)
Creatinine, Ser: 3.65 mg/dL — ABNORMAL HIGH (ref 0.61–1.24)
GFR calc Af Amer: 16 mL/min — ABNORMAL LOW (ref >60)
GFR calc non Af Amer: 14 mL/min — ABNORMAL LOW (ref >60)
Glucose, Bld: 93 mg/dL (ref 70–99)
Potassium: 4.9 mmol/L (ref 3.5–5.1)
Sodium: 135 mmol/L (ref 135–145)

## 2019-01-14 LAB — HEMOGLOBIN AND HEMATOCRIT, BLOOD
HCT: 31.5 % — ABNORMAL LOW (ref 39.0–52.0)
Hemoglobin: 10 g/dL — ABNORMAL LOW (ref 13.0–17.0)

## 2019-01-14 MED ORDER — SENNOSIDES-DOCUSATE SODIUM 8.6-50 MG PO TABS: 1.0000 | ORAL_TABLET | Freq: Two times a day (BID) | ORAL | Status: AC

## 2019-01-14 MED ORDER — CIPROFLOXACIN HCL 500 MG PO TABS
500.0000 mg | ORAL_TABLET | Freq: Every day | ORAL | Status: DC
  Administered 2019-01-14: 500 mg via ORAL
  Filled 2019-01-14: qty 1

## 2019-01-14 MED ORDER — CIPROFLOXACIN HCL 500 MG PO TABS: 500.0000 mg | ORAL_TABLET | Freq: Every day | ORAL | 0 refills | Status: AC

## 2019-01-14 NOTE — TOC Transition Note (Addendum)
Transition of Care [facility]) - CM/SW Discharge Note   Patient Details  Name: Ray Howard MRN: [id number] Date of Birth: 1928-03-17  Transition of Care [facility]) CM/SW Contact:  Zenon Mayo, RN Phone Number: 01/14/2019, 12:30 PM   Clinical Narrative:    Patient for dc to home today, he is active with [facility] for RN, PT, OT, and aide and SW.  NCM informed Valarie with [facility].  Soc will begin 24-48 hrs post dc.  Patient will need transport home via ambulance.  NCM will set up transport for 2 pm. NCM contacted Delphine Skeet Simmer, she states someone will be there and address confirmed.     Final next level of care: [place] Barriers to Discharge: No Barriers Identified   Patient Goals and CMS Choice Patient states their goals for this hospitalization and ongoing recovery are:: get better CMS Medicare.gov Compare Post Acute Care list provided to:: Patient Choice offered to / list presented to : Patient  Discharge Placement                       Discharge Plan and Services                DME Arranged: (NA)         HH Arranged: RN, PT, OT, Nurse's Aide, Social Work [employer] Agency: [place] ([facility]) Date [place]: 01/14/19 Time [place]: 3 Representative spoke with at [place]: [place] ([place]) Interventions     Readmission Risk Interventions No flowsheet data found.

## 2019-01-14 NOTE — Discharge Summary (Signed)
Physician Discharge Summary  Ray Howard [id number] DOB: 08-08-1927 DOA: 01/08/2019  PCP: Hennie Duos, MD  Admit date: 01/08/2019 Discharge date: 01/14/2019  Time spent: 55 minutes  Recommendations for Outpatient Follow-up:  1. Follow-up with Dr Butch Penny Drosinis in 2 weeks.  On follow-up patient will need a basic metabolic profile done to follow-up on electrolytes and renal function.  Patient's Flomax was discontinued due to orthostatic hypotension.   Discharge Diagnoses:  Principal Problem:   Syncope and collapse Active Problems:   Chronic kidney disease, stage V (HCC)   Essential hypertension   Chronic obstructive pulmonary disease (COPD) ([place])   Syncope   Acute lower UTI   Cystitis due to Pseudomonas   Discharge Condition: Stable and improved  Diet recommendation: Heart healthy  Filed Weights   01/12/19 0528 01/13/19 0339 01/14/19 0635  Weight: 71 kg 71 kg 72 kg    History of present illness:  HPI per Dr. Serita Sheller is a 85 y.o. male with medical history significant for stage 5 chronic kidney disease, not on dialysis yet followed by nephrology yet [place], COPD, anemia of chronic disease, neurogenic bladder with urinary retention requiring self-catheterization, was just discharged from [facility] yesterday after admission for hypotension, acute kidney injury and hyperkalemia.  Patient's baseline creatinine is around 3, he was hospitalized a week ago with a creatinine of 5.3, potassium of 6 and hypotension, he was treated with IV fluids, sodium bicarbonate, followed by nephrology, renal ultrasound did not show hydronephrosis, he was also found to have Pseudomonas in his urine culture which was felt to be a contaminant by infectious disease, ultimately his creatinine improved to 3.0 and he was discharged home yesterday early afternoon. -Patient reports feeling okay at discharge he lives with his wife, he went to bed last night, woke up this  morning and felt faint, walked to the bathroom and remembers sitting down on his commode, does not recall the events after this, subsequently found down on the bathroom floor by family unresponsive, EMS was called patient was found to be tachycardic with a thready pulse and decreased respiration, subsequently regained his consciousness and was brought to ED. there was reported incontinence of stool on the bathroom floor. -Patient denies any complaints at this time, he denies any fevers or chills, no dyspnea chest pain or shortness of breath no palpitations no nausea vomiting or diarrhea  ED Course: Blood pressure was 103/58 on arrival, rest of vital signs were stable, labs noted stage IV-V kidney disease otherwise unremarkable labs  Hospital Course:  1 syncope/dizziness Patient presented with a syncopal episode and noted on arrival to have a blood pressure of 103/58.  Patient with a history of stage IV stage V chronic kidney disease.  Patient does endorse feeling lightheaded and dizzy prior to syncopal episode.  Patient also stated that from supine to sitting position has had some bouts of dizziness.  Patient denied any tongue biting no jerking motions of extremities prior to admission.  Patient did note he had some bowel incontinence.  Orthostatics were ordered on admission however not done. Patient with no signs or symptoms of infection.  Urinalysis with large leukocytes, greater than 50 WBC.  Urine cultures with > 100,000 GNR.  Patient still with dizziness which improved during the hospitalization.  2D echo with EF of greater than 65%, moderately increased left ventricular wall thickness, no stenosis of aortic valve.  No wall motion abnormalities. Head CT which was done was unremarkable.  MRI head  was negative for any acute abnormalities.  Patient still noted to be orthostatic however improved and had resolved by day of discharge.  Patient's Flomax was held during the hospitalization and discontinued on  discharge due to concerns for orthostatic hypotension.  Patient afebrile.  Urine cultures with greater than 100,000 colonies of Pseudomonas.  Due to patient's presentation with dizziness and syncopal episode patient treated empirically with IV antibiotics.  Patient initially placed on IV Rocephin and once sensitivities are resulted was switched to IV ciprofloxacin.  Patient was transitioned to oral ciprofloxacin and be discharged home on 2 more days of oral ciprofloxacin to complete a 5-day course of antibiotic treatment. Due to ongoing dizziness, neurology was consulted who feel no further work-up is needed at this time and to continue supportive care.  Meclizine was placed as needed.  Patient received some gentle hydration initially during the hospitalization which was subsequently discontinued.  Patient improved clinically.  Dizziness improved.  Patient had no syncopal episodes during the hospitalization.  Patient be discharged home in stable and improved condition with home health therapies.  2.  Orthostatic hypotension. Patient noted to be orthostatic.  Patient had presented with syncope and some complaints of dizziness.  Patient with dizziness while laying flat. Orthostatic vital signs are positive and seem to have improved.  Patient however currently looks euvolemic on examination.  Patient with chronic kidney disease stage V and due to concerns for volume overload,  IV fluids have been saline locked.  Patient placed on TED hose.  Orthostatic hypotension had resolved by day of discharge.  Outpatient follow-up with PCP.  Patient's Flomax has been discontinued on discharge.   3.  Chronic kidney disease stage V Remained stable throughout the hospitalization.  Patient maintained on home regimen of calcitriol and bicarb tablets.  Patient remained euvolemic during the hospitalization.  Outpatient follow-up.  4.  COPD Stable.  5.  Neurogenic bladder with chronic retention Patient requiring I and O  catheterizations intermittently at baseline.  Foley catheter ordered and was discontinued on discharge.  Patient's Flomax has been discontinued as it was felt this could exacerbate her orthostatic hypotension.  Outpatient follow-up with PCP.    6.  Anemia Likely dilutional.  Patient denied any overt bleeding.  Anemia panel consistent with anemia of chronic disease.  H&H stable at 10.  Outpatient follow-up with PCP.  7.  Pseudomonas UTI. Urinalysis positive leukocytes, nitrite negative, WBC greater than 50.  Urine cultures with greater than 100,000 colonies of pseudomonas aeruginosa.  Patient initially placed on IV Rocephin has been transitioned to IV ciprofloxacin.  Patient subsequently transition to oral ciprofloxacin and will be discharged home on 2 more days of oral ciprofloxacin to complete a 5-day course of antibiotic treatment.  Outpatient follow-up with PCP.    Procedures:  2D echo 01/10/2019  CT head CT C-spine 01/08/2019  Chest x-ray 01/08/2019  MRI head 01/10/2019  Consultations:  Neurology: Dr. Leonel Ramsay 01/11/2019  Discharge Exam: Vitals:   01/13/19 1951 01/14/19 0419  BP: 133/64 123/66  Pulse: 77 77  Resp: 20 20  Temp: 98 F (36.7 C) 98 F (36.7 C)  SpO2: 96% 97%    General: NAD Cardiovascular: RRR Respiratory: CTAB  Discharge Instructions   Discharge Instructions    Diet - low sodium heart healthy   Complete by: As directed    Increase activity slowly   Complete by: As directed      Allergies as of 01/14/2019   No Known Allergies     Medication List  STOP taking these medications   tamsulosin 0.4 MG Caps capsule Commonly known as: FLOMAX     TAKE these medications   acetaminophen 325 MG tablet Commonly known as: TYLENOL Take 650 mg by mouth every 6 (six) hours as needed.   calcitRIOL 0.5 MCG capsule Commonly known as: ROCALTROL Take 1 capsule (0.5 mcg total) by mouth daily.   ciprofloxacin 500 MG tablet Commonly known as: CIPRO Take 1  tablet (500 mg total) by mouth daily for 2 days. Start taking on: January 15, 2019   febuxostat 40 MG tablet Commonly known as: ULORIC Take 0.5 tablets (20 mg total) by mouth daily. Give 1/2 tablet ( 20 mg ) by mouth daily for Gout   senna-docusate 8.6-50 MG tablet Commonly known as: Senokot-S Take 1 tablet by mouth 2 (two) times daily.   sodium bicarbonate 650 MG tablet Take 2 tablets (1,300 mg total) by mouth 3 (three) times daily.      No Known Allergies Follow-up Information    Drosinis,Donna. Schedule an appointment as soon as possible for a visit in 2 week(s).   Why: f/u in 2 weeks Contact information: [facility] [phone]           The results of significant diagnostics from this hospitalization (including imaging, microbiology, ancillary and laboratory) are listed below for reference.    Significant Diagnostic Studies: Dg Chest 2 View  Result Date: 01/08/2019 CLINICAL DATA:  Syncope versus seizure EXAM: CHEST - 2 VIEW COMPARISON:  November 29, 2018 FINDINGS: The lungs are clear without focal consolidation, edema, effusion or pneumothorax. Cardiomediastinal silhouette is within normal limits for size. No acute osseous findings. Again noted is bilateral shoulder arthropathy. Degenerative changes seen in the midthoracic spine. No acute osseous findings. IMPRESSION: No acute cardiopulmonary process. Electronically Signed   By: Prudencio Pair M.D.   On: 01/08/2019 13:09   Ct Head Wo Contrast  Result Date: 01/08/2019 CLINICAL DATA:  Possible seizure versus syncope EXAM: CT HEAD WITHOUT CONTRAST TECHNIQUE: Contiguous axial images were obtained from the base of the skull through the vertex without intravenous contrast. COMPARISON:  10/09/2018 FINDINGS: Brain: Moderate atrophy unchanged. Extensive white matter disease bilaterally is stable from the prior study. Chronic lacunar infarction right thalamus is unchanged. Negative for acute infarct, hemorrhage,  mass. Vascular: Negative for hyperdense vessel. Atherosclerotic calcification in the cavernous carotid bilaterally. Skull: Negative Sinuses/Orbits: Extensive chronic thickening of the left maxillary sinus and left sphenoid sinus due to chronic inflammation. Prior sinus surgery on the left. No orbital lesion. Other: None IMPRESSION: Moderate atrophy and moderate to extensive chronic ischemic change. No acute abnormality and no change from the prior CT. Electronically Signed   By: Franchot Gallo M.D.   On: 01/08/2019 14:49   Ct Cervical Spine Wo Contrast  Result Date: 01/08/2019 CLINICAL DATA:  Seizure versus syncope. EXAM: CT CERVICAL SPINE WITHOUT CONTRAST TECHNIQUE: Multidetector CT imaging of the cervical spine was performed without intravenous contrast. Multiplanar CT image reconstructions were also generated. COMPARISON:  None. FINDINGS: Alignment: Straightening of the cervical lordosis. Skull base and vertebrae: No acute fracture. No primary bone lesion or focal pathologic process. Soft tissues and spinal canal: No prevertebral fluid or swelling. No visible canal hematoma. Disc levels: Multilevel osteoarthritic changes with disc space narrowing remodeling of vertebral bodies and osteophytosis. Upper chest: Negative. Other: Carotid artery calcifications. IMPRESSION: 1. No evidence of acute traumatic injury to the cervical spine. 2. Multilevel osteoarthritic changes of the cervical spine. Electronically Signed   By: Thomas Hoff  Dimitrova M.D.   On: 01/08/2019 14:53   Mr Brain Wo Contrast  Result Date: 01/10/2019 CLINICAL DATA:  Persistent vertigo. EXAM: MRI HEAD WITHOUT CONTRAST TECHNIQUE: Multiplanar, multiecho pulse sequences of the brain and surrounding structures were obtained without intravenous contrast. COMPARISON:  Head CT 01/08/2019 FINDINGS: Brain: Diffusion imaging does not show any acute or subacute infarction. There chronic small-vessel ischemic changes of the pons. Few old small vessel  cerebellar infarctions. 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 Extensive chronic small-vessel ischemic changes throughout the deep and subcortical white matter. Multiple dilated perivascular spaces affecting the cerebral hemispheres. No large vessel territory infarction. No mass lesion, acute hemorrhage, hydrocephalus or extra-axial collection. Punctate foci of hemosiderin associated with many of the old small vessel infarctions. Vascular: Major vessels at the base of the brain show flow. Skull and upper cervical spine: Negative Sinuses/Orbits: Clear/normal. Other: No fluid in the mastoid air cells. IMPRESSION: No acute or reversible finding. No specific abnormality seen to explain vertigo. Extensive chronic small-vessel ischemic changes affecting the brainstem, right thalamus and cerebral hemispheric white matter. Electronically Signed   By: Nelson Chimes M.D.   On: 01/10/2019 14:58    Microbiology: Recent Results (from the past 240 hour(s))  SARS CORONAVIRUS 2 Nasal Swab Aptima Multi Swab     Status: None   Collection Time: 01/09/19 12:25 PM   Specimen: Aptima Multi Swab; Nasal Swab  Result Value Ref Range Status   SARS Coronavirus 2 NEGATIVE NEGATIVE Final    Comment: (NOTE) SARS-CoV-2 target nucleic acids are NOT DETECTED. The SARS-CoV-2 RNA is generally detectable in upper and lower respiratory specimens during the acute phase of infection. Negative results do not preclude SARS-CoV-2 infection, do not rule out co-infections with other pathogens, and should not be used as the sole basis for treatment or other patient management decisions. Negative results must be combined with clinical observations, patient history, and epidemiological information. The expected result is Negative. Fact Sheet for Patients: SugarRoll.be Fact Sheet for Healthcare Providers: https://www.woods-mathews.com/ This test is not yet approved or cleared by the [place] FDA  and  has been authorized for detection and/or diagnosis of SARS-CoV-2 by FDA under an Emergency Use Authorization (EUA). This EUA will remain  in effect (meaning this test can be used) for the duration of the COVID-19 declaration under Section 56 4(b)(1) of the Act, 21 U.S.C. section 360bbb-3(b)(1), unless the authorization is terminated or revoked sooner. Performed at [facility], [place] [address] [place], [place] [address]   Culture, Urine     Status: Abnormal   Collection Time: 01/11/19 12:47 AM   Specimen: Urine, Catheterized  Result Value Ref Range Status   Specimen Description URINE, CATHETERIZED  Final   Special Requests   Final    NONE Performed at [facility], [place] [address] [place], [place] [address]    Culture >=100,000 COLONIES/mL PSEUDOMONAS AERUGINOSA (A)  Final   Report Status 01/13/2019 FINAL  Final   Organism ID, Bacteria PSEUDOMONAS AERUGINOSA (A)  Final      Susceptibility   Pseudomonas aeruginosa - MIC*    CEFTAZIDIME 4 SENSITIVE Sensitive     CIPROFLOXACIN <=0.25 SENSITIVE Sensitive     GENTAMICIN <=1 SENSITIVE Sensitive     IMIPENEM <=0.25 SENSITIVE Sensitive     PIP/TAZO 64 SENSITIVE Sensitive     CEFEPIME 4 SENSITIVE Sensitive     * >=100,000 COLONIES/mL PSEUDOMONAS AERUGINOSA     Labs: Basic Metabolic Panel: Recent Labs  Lab 01/08/19 1258  01/10/19  [id number] 01/11/19 0230 01/12/19 0512 01/13/19 0606 01/14/19 0350  NA 138   < > 137 139 138 138 135  K 5.1   < > 4.5 4.9 4.9 5.0 4.9  CL 108   < > 108 108 106 107 103  CO2 20*   < > 19* 21* 21* 22 20*  GLUCOSE 104*   < > 97 96 94 88 93  BUN 46*   < > 47* 48* 48* 58* 62*  CREATININE 3.49*   < > 3.16* 3.08* 3.11* 3.66* 3.65*  CALCIUM 10.1   < > 9.3 9.8 9.7 9.7 9.6  MG 2.0  --   --   --   --   --   --   PHOS  --   --  2.8 3.1  --   --   --    < > = values in this interval not displayed.   Liver Function Tests: Recent Labs  Lab 01/08/19 1258 01/09/19 0438 01/10/19 0511  01/11/19 0230  AST 20 17  --   --   ALT 10 10  --   --   ALKPHOS 46 39  --   --   BILITOT 0.7 0.6  --   --   PROT 8.1 6.4*  --   --   ALBUMIN 3.6 2.9* 2.8* 2.9*   No results for input(s): LIPASE, AMYLASE in the last 168 hours. No results for input(s): AMMONIA in the last 168 hours. CBC: Recent Labs  Lab 01/08/19 1258 01/09/19 0438 01/10/19 0511 01/11/19 0230 01/12/19 0512 01/13/19 0606 01/14/19 0350  WBC 5.4 4.5 4.4 5.3 4.5  --   --   NEUTROABS 4.1  --   --   --   --   --   --   HGB 11.1* 9.3* 9.1* 9.6* 9.6* 9.5* 10.0*  HCT 36.9* 30.0* 28.9* 31.0* 31.3* 30.9* 31.5*  MCV 97.1 94.3 93.5 94.8 94.8  --   --   PLT 188 175 147* 184 171  --   --    Cardiac Enzymes: No results for input(s): CKTOTAL, CKMB, CKMBINDEX, TROPONINI in the last 168 hours. BNP: BNP (last 3 results) No results for input(s): BNP in the last 8760 hours.  ProBNP (last 3 results) No results for input(s): PROBNP in the last 8760 hours.  CBG: Recent Labs  Lab 01/08/19 1247  GLUCAP 101*       Signed:  Irine Seal MD.  [facility] 01/14/2019, 10:52 AM

## 2019-01-14 NOTE — Care Management Important Message (Signed)
Important Message  Patient Details  Name: Ray Howard MRN: [id number] Date of Birth: 1927-08-10   Medicare Important Message Given:  Yes     Memory Argue 01/14/2019, 11:42 AM

## 2020-02-04 DEATH — deceased

## 2020-10-05 IMAGING — CT CT CERVICAL SPINE WITHOUT CONTRAST
4 series · 15 of 33 positions shown, 18 images · non-contrast
Comparison: None.

CLINICAL DATA: Seizure versus syncope.

EXAM:
CT CERVICAL SPINE WITHOUT CONTRAST
TECHNIQUE: Multidetector CT imaging of the cervical spine was performed without
intravenous contrast. Multiplanar CT image reconstructions were also
generated.

[Series 5: c spine soft · axial · 0.37mm/px · z∈[+1256,+1288]mm · 2 of 95 slices shown]
[im 16/95  soft-tissue]
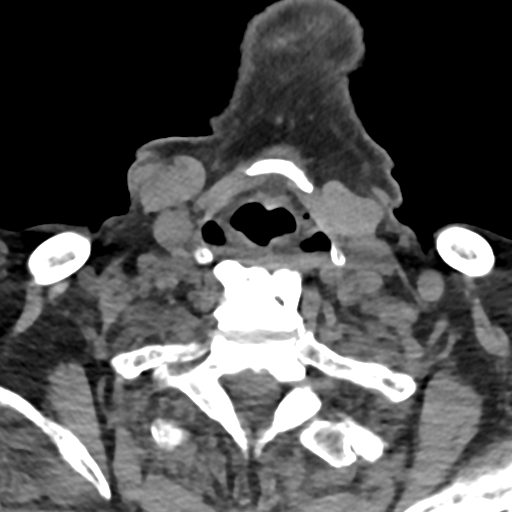
[im 32/95  soft-tissue]
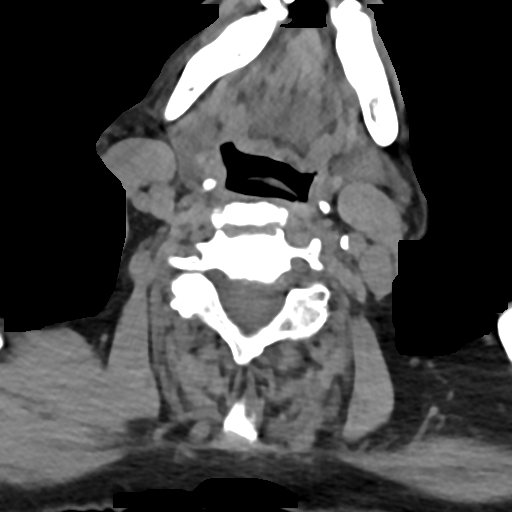

[Series 6: sag bone · sagittal · 0.42mm/px · 5 of 61 slices shown, 6 images]
[im 21/61  bone]
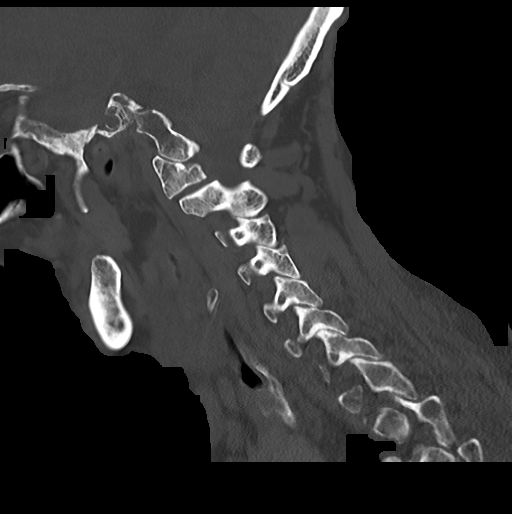
[im 26/61  bone]
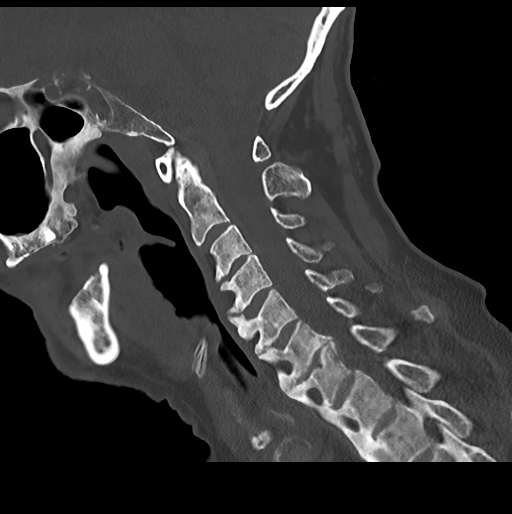
[im 31/61  soft-tissue]
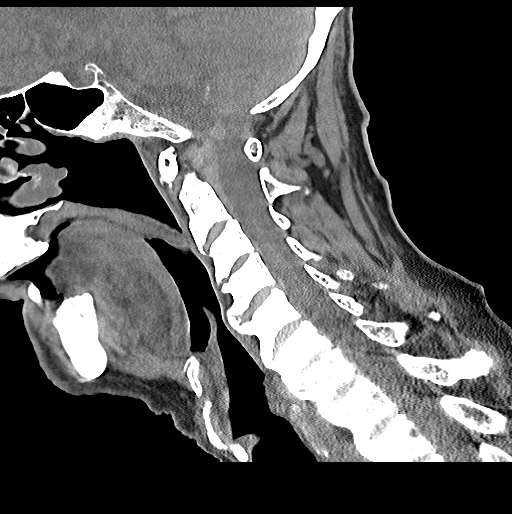
[im 31/61  bone]
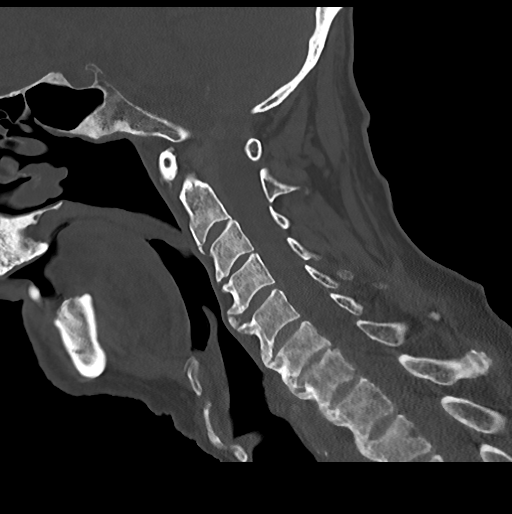
[im 36/61  bone]
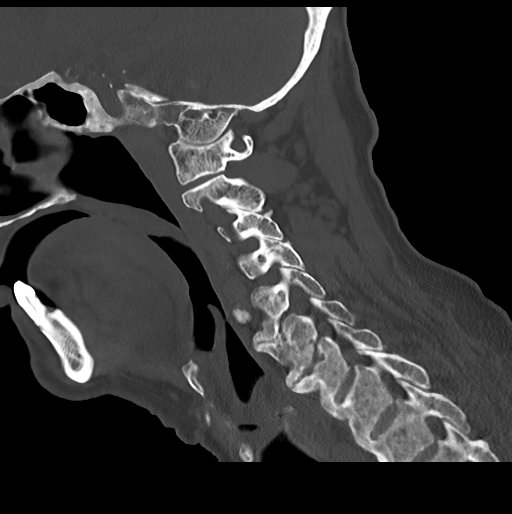
[im 41/61  bone]
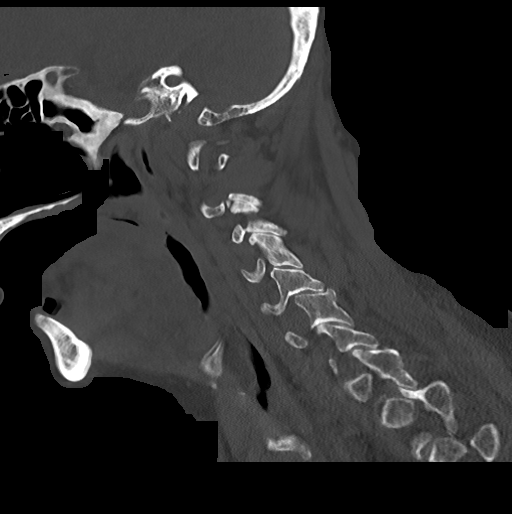

[Series 7: cor bone · coronal · 0.28mm/px · 3 of 86 slices shown]
[im 18/86  bone]
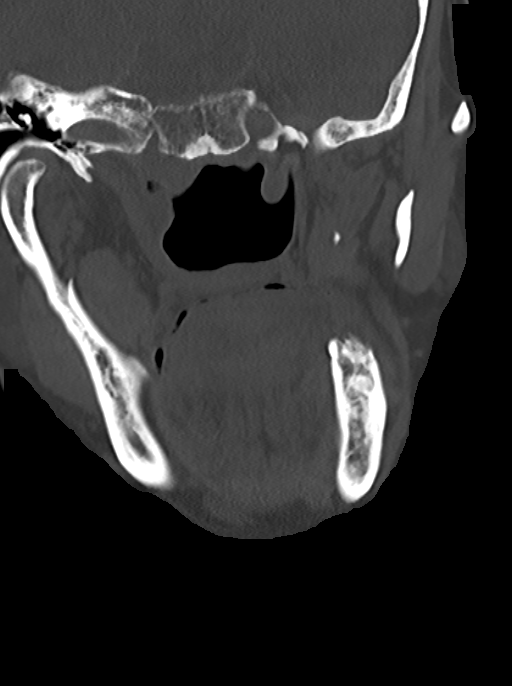
[im 35/86  bone]
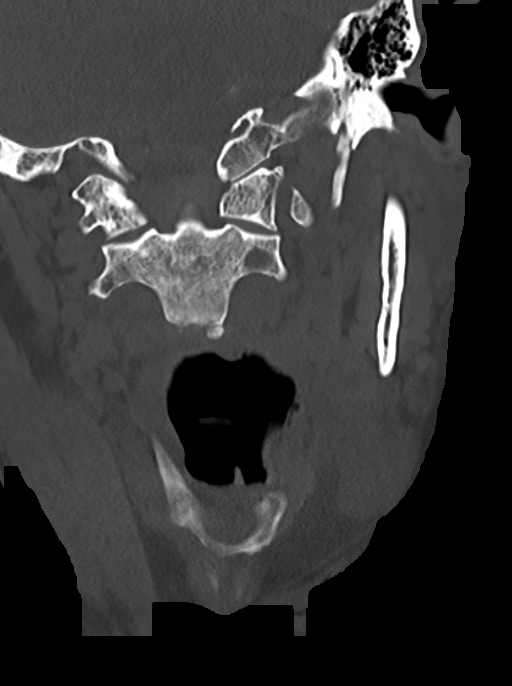
[im 52/86  bone]
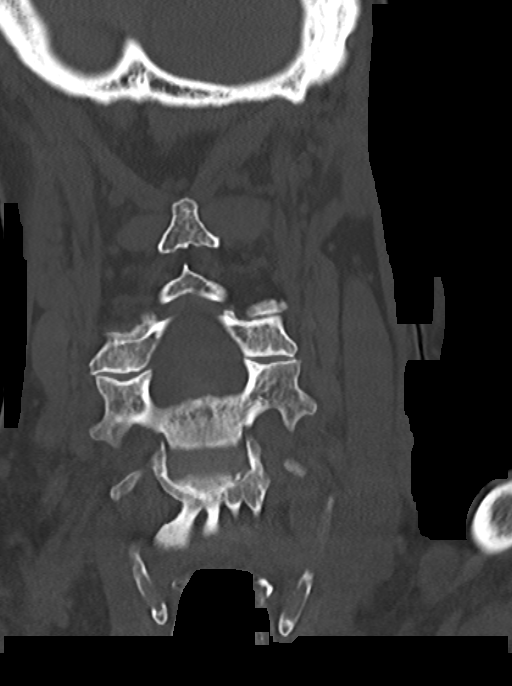

[Series 8: orthogonal axials · axial · 0.21mm/px · z∈[+1225,+1328]mm · 5 of 90 slices shown, 7 images]
[im 15/90  soft-tissue]
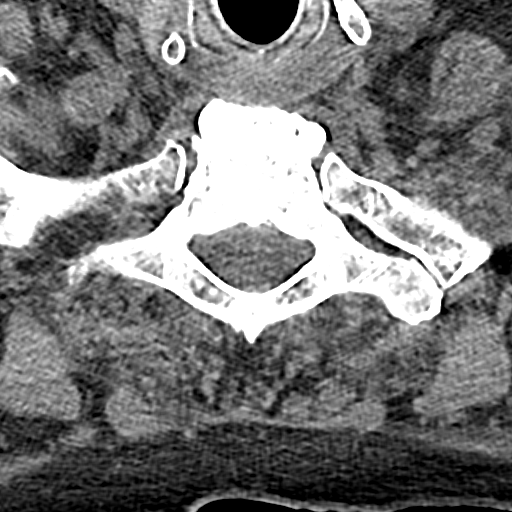
[im 15/90  bone]
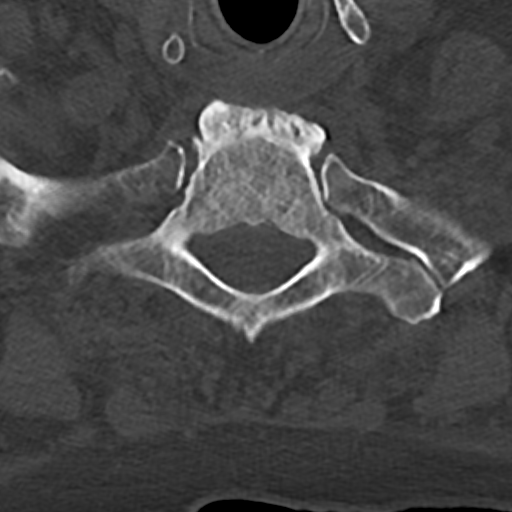
[im 30/90  bone]
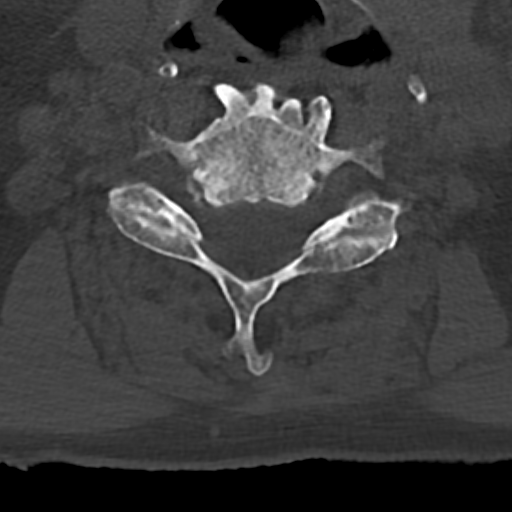
[im 45/90  bone]
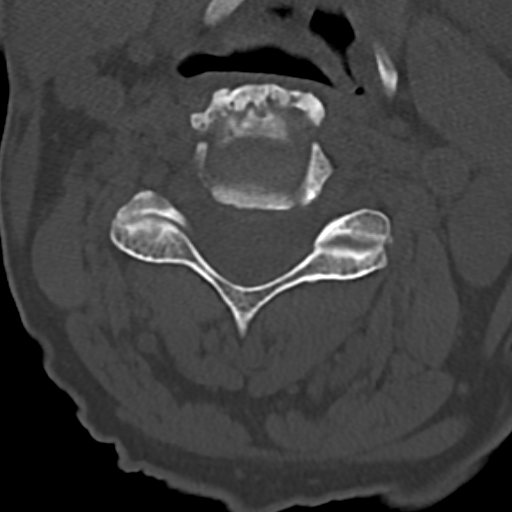
[im 60/90  bone]
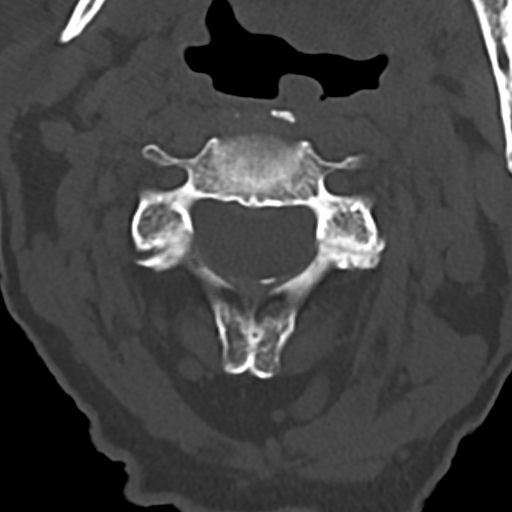
[im 75/90  soft-tissue]
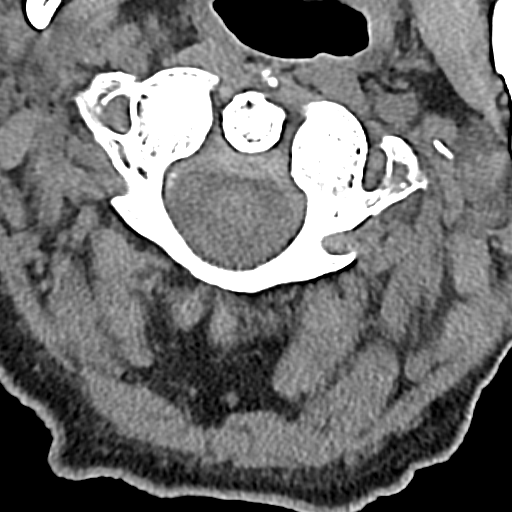
[im 75/90  bone]
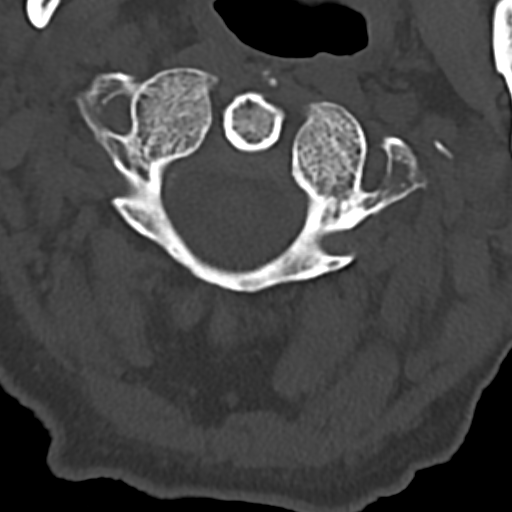

[15 of 33 positions shown; findings below may reference images not displayed]

FINDINGS: Alignment: Straightening of the cervical lordosis.

Skull base and vertebrae: No acute fracture. No primary bone lesion
or focal pathologic process.

Soft tissues and spinal canal: No prevertebral fluid or swelling. No
visible canal hematoma.

Disc levels: Multilevel osteoarthritic changes with disc space
narrowing remodeling of vertebral bodies and osteophytosis.

Upper chest: Negative.

Other: Carotid artery calcifications.
IMPRESSION: 1. No evidence of acute traumatic injury to the cervical spine.
2. Multilevel osteoarthritic changes of the cervical spine.
